# Patient Record
Sex: Male | Born: 1935 | Race: Black or African American | Hispanic: No | Smoking: Former smoker
Health system: Southern US, Community
[De-identification: ages and names within clinical notes are randomized; demographics above are authoritative.]

## PROBLEM LIST (undated history)

## (undated) DIAGNOSIS — R0602 Shortness of breath: Secondary | ICD-10-CM

## (undated) DIAGNOSIS — I2510 Atherosclerotic heart disease of native coronary artery without angina pectoris: Secondary | ICD-10-CM

## (undated) DIAGNOSIS — I10 Essential (primary) hypertension: Secondary | ICD-10-CM

## (undated) DIAGNOSIS — I639 Cerebral infarction, unspecified: Secondary | ICD-10-CM

## (undated) DIAGNOSIS — K8020 Calculus of gallbladder without cholecystitis without obstruction: Secondary | ICD-10-CM

## (undated) DIAGNOSIS — E46 Unspecified protein-calorie malnutrition: Secondary | ICD-10-CM

## (undated) DIAGNOSIS — I219 Acute myocardial infarction, unspecified: Secondary | ICD-10-CM

## (undated) DIAGNOSIS — IMO0002 Reserved for concepts with insufficient information to code with codable children: Secondary | ICD-10-CM

## (undated) DIAGNOSIS — H53129 Transient visual loss, unspecified eye: Secondary | ICD-10-CM

## (undated) DIAGNOSIS — H548 Legal blindness, as defined in USA: Secondary | ICD-10-CM

## (undated) DIAGNOSIS — I4891 Unspecified atrial fibrillation: Secondary | ICD-10-CM

## (undated) DIAGNOSIS — J449 Chronic obstructive pulmonary disease, unspecified: Secondary | ICD-10-CM

## (undated) DIAGNOSIS — I8290 Acute embolism and thrombosis of unspecified vein: Secondary | ICD-10-CM

## (undated) DIAGNOSIS — E785 Hyperlipidemia, unspecified: Secondary | ICD-10-CM

## (undated) HISTORY — DX: Acute myocardial infarction, unspecified: I21.9

## (undated) HISTORY — DX: Essential (primary) hypertension: I10

## (undated) HISTORY — PX: TRANSMETATARSAL AMPUTATION: SHX6197

## (undated) HISTORY — DX: Cerebral infarction, unspecified: I63.9

## (undated) HISTORY — PX: PR VEIN BYPASS GRAFT,AORTO-FEM-POP: 35551

## (undated) HISTORY — DX: Acute embolism and thrombosis of unspecified vein: I82.90

## (undated) HISTORY — DX: Transient visual loss, unspecified eye: H53.129

## (undated) HISTORY — DX: Atherosclerotic heart disease of native coronary artery without angina pectoris: I25.10

## (undated) HISTORY — PX: ABOVE KNEE LEG AMPUTATION: SUR20

## (undated) HISTORY — DX: Hyperlipidemia, unspecified: E78.5

## (undated) HISTORY — PX: OTHER SURGICAL HISTORY: SHX169

## (undated) HISTORY — PX: THORACOTOMY: SUR1349

## (undated) HISTORY — PX: ANTERIOR CERVICAL DECOMP/DISCECTOMY FUSION: SHX1161

---

## 2003-11-15 DIAGNOSIS — I219 Acute myocardial infarction, unspecified: Secondary | ICD-10-CM

## 2003-11-15 HISTORY — DX: Acute myocardial infarction, unspecified: I21.9

## 2008-10-14 DIAGNOSIS — I639 Cerebral infarction, unspecified: Secondary | ICD-10-CM

## 2008-10-14 HISTORY — DX: Cerebral infarction, unspecified: I63.9

## 2008-10-27 ENCOUNTER — Inpatient Hospital Stay (HOSPITAL_COMMUNITY): Admission: EM | Admit: 2008-10-27 | Discharge: 2008-10-31 | Payer: Self-pay | Admitting: Emergency Medicine

## 2008-10-29 ENCOUNTER — Ambulatory Visit: Payer: Self-pay | Admitting: Physical Medicine & Rehabilitation

## 2008-10-29 ENCOUNTER — Encounter (INDEPENDENT_AMBULATORY_CARE_PROVIDER_SITE_OTHER): Payer: Self-pay | Admitting: Internal Medicine

## 2008-10-31 ENCOUNTER — Inpatient Hospital Stay (HOSPITAL_COMMUNITY)
Admission: RE | Admit: 2008-10-31 | Discharge: 2008-11-18 | Payer: Self-pay | Admitting: Physical Medicine & Rehabilitation

## 2008-11-14 DIAGNOSIS — I4891 Unspecified atrial fibrillation: Secondary | ICD-10-CM

## 2008-11-14 HISTORY — PX: OTHER SURGICAL HISTORY: SHX169

## 2008-11-14 HISTORY — DX: Unspecified atrial fibrillation: I48.91

## 2008-11-14 HISTORY — PX: HIP FRACTURE SURGERY: SHX118

## 2008-12-09 ENCOUNTER — Encounter (HOSPITAL_COMMUNITY): Admission: RE | Admit: 2008-12-09 | Discharge: 2009-03-09 | Payer: Self-pay | Admitting: Endocrinology

## 2008-12-16 ENCOUNTER — Ambulatory Visit (HOSPITAL_COMMUNITY): Admission: RE | Admit: 2008-12-16 | Discharge: 2008-12-16 | Payer: Self-pay | Admitting: Neurology

## 2009-01-23 ENCOUNTER — Ambulatory Visit (HOSPITAL_COMMUNITY): Admission: RE | Admit: 2009-01-23 | Discharge: 2009-01-23 | Payer: Self-pay | Admitting: Interventional Radiology

## 2009-01-30 ENCOUNTER — Telehealth: Payer: Self-pay | Admitting: Gastroenterology

## 2009-01-30 DIAGNOSIS — D62 Acute posthemorrhagic anemia: Secondary | ICD-10-CM

## 2009-01-30 DIAGNOSIS — E119 Type 2 diabetes mellitus without complications: Secondary | ICD-10-CM | POA: Insufficient documentation

## 2009-02-02 ENCOUNTER — Ambulatory Visit: Payer: Self-pay | Admitting: Gastroenterology

## 2009-02-02 DIAGNOSIS — I6350 Cerebral infarction due to unspecified occlusion or stenosis of unspecified cerebral artery: Secondary | ICD-10-CM | POA: Insufficient documentation

## 2009-02-12 ENCOUNTER — Ambulatory Visit: Payer: Self-pay | Admitting: Gastroenterology

## 2009-02-18 ENCOUNTER — Encounter: Payer: Self-pay | Admitting: Gastroenterology

## 2009-02-18 ENCOUNTER — Ambulatory Visit: Payer: Self-pay | Admitting: Gastroenterology

## 2009-02-20 ENCOUNTER — Encounter: Payer: Self-pay | Admitting: Gastroenterology

## 2009-02-23 ENCOUNTER — Telehealth: Payer: Self-pay | Admitting: Gastroenterology

## 2009-02-23 ENCOUNTER — Encounter: Payer: Self-pay | Admitting: Gastroenterology

## 2009-02-24 ENCOUNTER — Telehealth: Payer: Self-pay | Admitting: Gastroenterology

## 2009-03-08 ENCOUNTER — Encounter: Payer: Self-pay | Admitting: Gastroenterology

## 2009-03-19 ENCOUNTER — Encounter: Payer: Self-pay | Admitting: Gastroenterology

## 2009-03-20 ENCOUNTER — Telehealth: Payer: Self-pay | Admitting: Gastroenterology

## 2009-03-20 ENCOUNTER — Inpatient Hospital Stay (HOSPITAL_COMMUNITY): Admission: EM | Admit: 2009-03-20 | Discharge: 2009-03-21 | Payer: Self-pay | Admitting: Emergency Medicine

## 2009-03-23 ENCOUNTER — Telehealth: Payer: Self-pay | Admitting: Gastroenterology

## 2009-03-27 ENCOUNTER — Encounter: Payer: Self-pay | Admitting: Gastroenterology

## 2009-03-30 ENCOUNTER — Telehealth: Payer: Self-pay | Admitting: Gastroenterology

## 2009-04-01 ENCOUNTER — Encounter: Payer: Self-pay | Admitting: Gastroenterology

## 2009-04-02 ENCOUNTER — Telehealth: Payer: Self-pay | Admitting: Gastroenterology

## 2009-04-03 ENCOUNTER — Telehealth: Payer: Self-pay | Admitting: Gastroenterology

## 2009-04-03 ENCOUNTER — Encounter: Payer: Self-pay | Admitting: Gastroenterology

## 2009-04-07 ENCOUNTER — Inpatient Hospital Stay (HOSPITAL_COMMUNITY): Admission: RE | Admit: 2009-04-07 | Discharge: 2009-04-08 | Payer: Self-pay | Admitting: Interventional Radiology

## 2009-04-10 ENCOUNTER — Encounter: Payer: Self-pay | Admitting: Gastroenterology

## 2009-04-21 ENCOUNTER — Encounter: Payer: Self-pay | Admitting: Interventional Radiology

## 2009-05-03 ENCOUNTER — Inpatient Hospital Stay (HOSPITAL_COMMUNITY): Admission: EM | Admit: 2009-05-03 | Discharge: 2009-05-07 | Payer: Self-pay | Admitting: Emergency Medicine

## 2009-07-22 ENCOUNTER — Encounter (INDEPENDENT_AMBULATORY_CARE_PROVIDER_SITE_OTHER): Payer: Self-pay | Admitting: *Deleted

## 2009-07-22 ENCOUNTER — Ambulatory Visit (HOSPITAL_COMMUNITY): Admission: RE | Admit: 2009-07-22 | Discharge: 2009-07-22 | Payer: Self-pay | Admitting: Interventional Radiology

## 2009-08-12 ENCOUNTER — Encounter: Payer: Self-pay | Admitting: Gastroenterology

## 2009-08-21 ENCOUNTER — Encounter: Payer: Self-pay | Admitting: Gastroenterology

## 2009-08-25 ENCOUNTER — Encounter: Payer: Self-pay | Admitting: Gastroenterology

## 2009-09-01 ENCOUNTER — Encounter: Payer: Self-pay | Admitting: Gastroenterology

## 2009-09-15 ENCOUNTER — Encounter (INDEPENDENT_AMBULATORY_CARE_PROVIDER_SITE_OTHER): Payer: Self-pay | Admitting: *Deleted

## 2009-09-16 ENCOUNTER — Ambulatory Visit: Payer: Self-pay | Admitting: Gastroenterology

## 2009-09-18 ENCOUNTER — Telehealth: Payer: Self-pay | Admitting: Gastroenterology

## 2009-10-13 ENCOUNTER — Ambulatory Visit (HOSPITAL_COMMUNITY): Admission: RE | Admit: 2009-10-13 | Discharge: 2009-10-13 | Payer: Self-pay | Admitting: Gastroenterology

## 2009-10-13 ENCOUNTER — Ambulatory Visit: Payer: Self-pay | Admitting: Gastroenterology

## 2009-10-14 ENCOUNTER — Telehealth: Payer: Self-pay | Admitting: Gastroenterology

## 2009-10-14 ENCOUNTER — Encounter: Payer: Self-pay | Admitting: Gastroenterology

## 2009-11-05 ENCOUNTER — Encounter: Payer: Self-pay | Admitting: Gastroenterology

## 2009-11-14 DIAGNOSIS — E46 Unspecified protein-calorie malnutrition: Secondary | ICD-10-CM

## 2009-11-14 HISTORY — DX: Unspecified protein-calorie malnutrition: E46

## 2010-02-02 ENCOUNTER — Encounter: Payer: Self-pay | Admitting: Gastroenterology

## 2010-02-17 ENCOUNTER — Ambulatory Visit: Payer: Self-pay | Admitting: Gastroenterology

## 2010-03-02 ENCOUNTER — Encounter: Payer: Self-pay | Admitting: Gastroenterology

## 2010-03-16 ENCOUNTER — Encounter: Payer: Self-pay | Admitting: Gastroenterology

## 2010-04-13 ENCOUNTER — Ambulatory Visit: Payer: Self-pay | Admitting: Vascular Surgery

## 2010-04-13 ENCOUNTER — Inpatient Hospital Stay (HOSPITAL_COMMUNITY): Admission: EM | Admit: 2010-04-13 | Discharge: 2010-04-19 | Payer: Self-pay | Admitting: Emergency Medicine

## 2010-04-15 ENCOUNTER — Ambulatory Visit: Payer: Self-pay | Admitting: Infectious Disease

## 2010-04-15 ENCOUNTER — Encounter: Payer: Self-pay | Admitting: Vascular Surgery

## 2010-04-23 ENCOUNTER — Ambulatory Visit: Payer: Medicare Other | Admitting: Internal Medicine

## 2010-04-28 ENCOUNTER — Ambulatory Visit: Payer: Medicare Other | Admitting: Internal Medicine

## 2010-05-02 ENCOUNTER — Ambulatory Visit: Payer: Medicare Other | Admitting: Internal Medicine

## 2010-05-07 ENCOUNTER — Encounter (HOSPITAL_BASED_OUTPATIENT_CLINIC_OR_DEPARTMENT_OTHER): Admission: RE | Admit: 2010-05-07 | Discharge: 2010-06-29 | Payer: Self-pay | Admitting: Internal Medicine

## 2010-05-13 ENCOUNTER — Inpatient Hospital Stay (HOSPITAL_COMMUNITY): Admission: EM | Admit: 2010-05-13 | Discharge: 2010-05-15 | Payer: Self-pay | Admitting: Emergency Medicine

## 2010-05-14 ENCOUNTER — Ambulatory Visit: Payer: Self-pay | Admitting: Oncology

## 2010-05-19 ENCOUNTER — Ambulatory Visit: Payer: Medicare Other

## 2010-05-20 ENCOUNTER — Ambulatory Visit: Payer: Medicare Other | Admitting: Internal Medicine

## 2010-05-24 ENCOUNTER — Ambulatory Visit: Payer: Medicare Other | Admitting: Internal Medicine

## 2010-05-27 ENCOUNTER — Ambulatory Visit: Payer: Self-pay | Admitting: Vascular Surgery

## 2010-05-29 ENCOUNTER — Ambulatory Visit: Payer: Medicare Other | Admitting: Internal Medicine

## 2010-06-01 ENCOUNTER — Ambulatory Visit: Payer: Self-pay | Admitting: Vascular Surgery

## 2010-06-01 ENCOUNTER — Ambulatory Visit: Payer: Medicare Other

## 2010-06-01 ENCOUNTER — Encounter: Payer: Self-pay | Admitting: Vascular Surgery

## 2010-06-01 ENCOUNTER — Inpatient Hospital Stay (HOSPITAL_COMMUNITY): Admission: RE | Admit: 2010-06-01 | Discharge: 2010-06-07 | Payer: Self-pay | Admitting: Vascular Surgery

## 2010-07-14 ENCOUNTER — Ambulatory Visit: Payer: Self-pay | Admitting: Vascular Surgery

## 2010-08-26 ENCOUNTER — Emergency Department (HOSPITAL_COMMUNITY)
Admission: EM | Admit: 2010-08-26 | Discharge: 2010-08-26 | Payer: Self-pay | Source: Home / Self Care | Admitting: Emergency Medicine

## 2010-11-03 ENCOUNTER — Ambulatory Visit: Payer: Self-pay | Admitting: Vascular Surgery

## 2010-11-14 DIAGNOSIS — K8020 Calculus of gallbladder without cholecystitis without obstruction: Secondary | ICD-10-CM

## 2010-11-14 DIAGNOSIS — IMO0002 Reserved for concepts with insufficient information to code with codable children: Secondary | ICD-10-CM

## 2010-11-14 HISTORY — DX: Calculus of gallbladder without cholecystitis without obstruction: K80.20

## 2010-11-14 HISTORY — DX: Reserved for concepts with insufficient information to code with codable children: IMO0002

## 2010-12-01 ENCOUNTER — Ambulatory Visit
Admission: RE | Admit: 2010-12-01 | Discharge: 2010-12-01 | Payer: Self-pay | Source: Home / Self Care | Attending: Vascular Surgery | Admitting: Vascular Surgery

## 2010-12-01 ENCOUNTER — Encounter
Admission: RE | Admit: 2010-12-01 | Discharge: 2010-12-01 | Payer: Self-pay | Source: Home / Self Care | Attending: Vascular Surgery | Admitting: Vascular Surgery

## 2010-12-05 ENCOUNTER — Encounter: Payer: Self-pay | Admitting: Physical Medicine & Rehabilitation

## 2010-12-05 ENCOUNTER — Encounter: Payer: Self-pay | Admitting: Interventional Radiology

## 2010-12-05 ENCOUNTER — Encounter: Payer: Self-pay | Admitting: Cardiology

## 2010-12-14 NOTE — Assessment & Plan Note (Signed)
Summary: F/U FROM ENDO. AND LABS               DEBORAH   History of Present Illness Visit Type: Follow-up Visit Primary GI MD: Melvia Heaps MD [facility] Primary Provider: Adrian Prince, MD Requesting Provider: Baltazar Najjar, MD Chief Complaint: endo/labs History of Present Illness:   [name] has return for scheduled GI followup.  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  He has chronic GI  blood loss, presumably due to other small bowel AVMs.  He takes iron 3 times a day.  Hemoglobin on February 03, 2010 was 9.4.   GI Review of Systems      Denies abdominal pain, acid reflux, belching, bloating, chest pain, dysphagia with liquids, dysphagia with solids, heartburn, loss of appetite, nausea, vomiting, vomiting blood, weight loss, and  weight gain.        Denies anal fissure, black tarry stools, change in bowel habit, constipation, diarrhea, diverticulosis, fecal incontinence, heme positive stool, hemorrhoids, irritable bowel syndrome, jaundice, light color stool, liver problems, rectal bleeding, and  rectal pain.    Current Medications (verified): 1)  Actos 45 Mg Tabs (Pioglitazone Hcl) .Marland Kitchen.. 1 Once Daily 2)  Ferrous Sulfate 325 (65 Fe) Mg  Tabs (Ferrous Sulfate) .... One Tablet By Mouth Three Times A Day 3)  Lipitor 80 Mg Tabs (Atorvastatin Calcium) .... One Tablet By Mouth At Bedtime 4)  Metformin Hcl 850 Mg Tabs (Metformin Hcl) .... One Tablet By Mouth Two Times A Day 5)  Plavix 75 Mg Tabs (Clopidogrel Bisulfate) .... On Hold 6)  Zantac 150 Mg Tabs (Ranitidine Hcl) .... One Tablet By Mouth Two Times A Day 7)  Zolpidem Tartrate 5 Mg Tabs (Zolpidem Tartrate) .... One Tablet By Mouth At Bedtime As Needed 8)  Metoprolol Tartrate 25 Mg Tabs (Metoprolol Tartrate) .... Once Daily 9)  Oyst-Cal 500 Mg Tabs (Oyster Shell) .... Two Times A Day 10)  Nitrostat 0.4 Mg Subl (Nitroglycerin) .... As Needed 11)  Tylenol 325 Mg Tabs (Acetaminophen) .... As Needed 12)  Vitamin D  50000iu .... Once Monthly  Allergies (verified): No Known Drug Allergies  Past History:  Past Medical History: Reviewed history from 02/02/2009 and no changes required. Current Problems:  DM (ICD-250.00) ANEMIA (ICD-285.9) Diabetes Hyperlipidemia Stroke Coronary Artery Disease  Past Surgical History: Reviewed history from 02/02/2009 and no changes required. Rt AKA Left transmetatarsal amputation Thoracotomy  Family History: Reviewed history from 02/02/2009 and no changes required. Family History of Diabetes:  Family History of Heart Disease:   Social History: Reviewed history from 02/02/2009 and no changes required. widowed, 1 girl Patient is a former smoker.  Alcohol Use - no Illicit Drug Use - no  Review of Systems  The patient denies allergy/sinus, anemia, anxiety-new, arthritis/joint pain, back pain, blood in urine, breast changes/lumps, change in vision, confusion, cough, coughing up blood, depression-new, fainting, fatigue, fever, headaches-new, hearing problems, heart murmur, heart rhythm changes, itching, menstrual pain, muscle pains/cramps, night sweats, nosebleeds, pregnancy symptoms, shortness of breath, skin rash, sleeping problems, sore throat, swelling of feet/legs, swollen lymph glands, thirst - excessive , urination - excessive , urination changes/pain, urine leakage, vision changes, and voice change.    Vital Signs:  Patient profile:   76 year old male Pulse rate:   60 / minute Pulse rhythm:   regular BP sitting:   110 / 60  (left arm) Cuff size:   regular  Vitals Entered By: June McMurray CMA Duncan Dull) (February 17, 2010  10:27 AM)   Impression & Recommendations:  Problem # 1:  ANEMIA (ICD-285.9) He has chronic  GI  blood loss very likely due to small bowel AVMs.  Blood counts have remained stable on supplementary iron  Recommendations #1 continue iron supplementation indefinitely #2 check CBC monthly #3 no further GI workup or therapy at this  time #4 continue to hold Plavix since this could exacerbate any bleeding.  Alternatively, Plavix can be resumed at the risk of worsening his rate of blood loss and possibly requiring blood transfusions.  I will leave this decision to Dr. Leanord Hawking  Patient Instructions: 1)  Please continue current medications.  2)  Please schedule a follow-up appointment as needed.  3)  The medication list was reviewed and reconciled.  All changed / newly prescribed medications were explained.  A complete medication list was provided to the patient / caregiver.

## 2010-12-15 ENCOUNTER — Encounter: Payer: Self-pay | Admitting: Vascular Surgery

## 2011-01-29 LAB — SURGICAL PCR SCREEN: Staphylococcus aureus: NEGATIVE

## 2011-01-29 LAB — BASIC METABOLIC PANEL
BUN: 20 mg/dL (ref 6–23)
Chloride: 97 mEq/L (ref 96–112)
Potassium: 3.7 mEq/L (ref 3.5–5.1)

## 2011-01-29 LAB — GLUCOSE, CAPILLARY
Glucose-Capillary: 107 mg/dL — ABNORMAL HIGH (ref 70–99)
Glucose-Capillary: 109 mg/dL — ABNORMAL HIGH (ref 70–99)
Glucose-Capillary: 132 mg/dL — ABNORMAL HIGH (ref 70–99)
Glucose-Capillary: 146 mg/dL — ABNORMAL HIGH (ref 70–99)
Glucose-Capillary: 152 mg/dL — ABNORMAL HIGH (ref 70–99)
Glucose-Capillary: 179 mg/dL — ABNORMAL HIGH (ref 70–99)
Glucose-Capillary: 182 mg/dL — ABNORMAL HIGH (ref 70–99)
Glucose-Capillary: 187 mg/dL — ABNORMAL HIGH (ref 70–99)
Glucose-Capillary: 192 mg/dL — ABNORMAL HIGH (ref 70–99)
Glucose-Capillary: 193 mg/dL — ABNORMAL HIGH (ref 70–99)
Glucose-Capillary: 233 mg/dL — ABNORMAL HIGH (ref 70–99)
Glucose-Capillary: 62 mg/dL — ABNORMAL LOW (ref 70–99)
Glucose-Capillary: 66 mg/dL — ABNORMAL LOW (ref 70–99)
Glucose-Capillary: 88 mg/dL (ref 70–99)

## 2011-01-29 LAB — CBC
Hemoglobin: 7.8 g/dL — ABNORMAL LOW (ref 13.0–17.0)
MCH: 29.8 pg (ref 26.0–34.0)
RBC: 2.62 MIL/uL — ABNORMAL LOW (ref 4.22–5.81)

## 2011-01-29 LAB — POCT I-STAT 4, (NA,K, GLUC, HGB,HCT)
Hemoglobin: 12.9 g/dL — ABNORMAL LOW (ref 13.0–17.0)
Potassium: 4 mEq/L (ref 3.5–5.1)

## 2011-01-30 LAB — COMPREHENSIVE METABOLIC PANEL
ALT: 11 U/L (ref 0–53)
Albumin: 2.2 g/dL — ABNORMAL LOW (ref 3.5–5.2)
Alkaline Phosphatase: 36 U/L — ABNORMAL LOW (ref 39–117)
BUN: 20 mg/dL (ref 6–23)
GFR calc Af Amer: >60 mL/min (ref >60)
Potassium: 4 mEq/L (ref 3.5–5.1)
Sodium: 137 mEq/L (ref 135–145)
Total Protein: 6 g/dL (ref 6.0–8.3)

## 2011-01-30 LAB — CROSSMATCH
ABO/RH(D): O POS
Antibody Screen: NEGATIVE

## 2011-01-30 LAB — RETICULOCYTES: Retic Ct Pct: 1.3 % (ref 0.4–3.1)

## 2011-01-30 LAB — LACTATE DEHYDROGENASE: LDH: 135 U/L (ref 94–250)

## 2011-01-30 LAB — CBC
Platelets: 198 10*3/uL (ref 150–400)
Platelets: 230 10*3/uL (ref 150–400)
RBC: 3.47 MIL/uL — ABNORMAL LOW (ref 4.22–5.81)
RDW: 16.5 % — ABNORMAL HIGH (ref 11.5–15.5)
WBC: 8.3 10*3/uL (ref 4.0–10.5)
WBC: 6.9 10*3/uL (ref 4.0–10.5)

## 2011-01-30 LAB — APTT: aPTT: 36 seconds (ref 24–37)

## 2011-01-30 LAB — FERRITIN: Ferritin: 73 ng/mL (ref 22–322)

## 2011-01-30 LAB — DIFFERENTIAL
Basophils Relative: 0 % (ref 0–1)
Monocytes Absolute: 0.8 10*3/uL (ref 0.1–1.0)
Monocytes Relative: 12 % (ref 3–12)
Neutro Abs: 5.4 10*3/uL (ref 1.7–7.7)

## 2011-01-30 LAB — BASIC METABOLIC PANEL
CO2: 29 mEq/L (ref 19–32)
GFR calc Af Amer: >60 mL/min (ref >60)
GFR calc non Af Amer: >60 mL/min (ref >60)
Glucose, Bld: 296 mg/dL — ABNORMAL HIGH (ref 70–99)
Potassium: 3.9 mEq/L (ref 3.5–5.1)
Sodium: 137 mEq/L (ref 135–145)

## 2011-01-30 LAB — SAMPLE TO BLOOD BANK

## 2011-01-30 LAB — FOLATE: Folate: >20 ng/mL

## 2011-01-30 LAB — SAVE SMEAR

## 2011-01-30 LAB — HAPTOGLOBIN: Haptoglobin: 313 mg/dL — ABNORMAL HIGH (ref 16–200)

## 2011-01-30 LAB — HEMOCCULT GUIAC POC 1CARD (OFFICE)
Fecal Occult Bld: NEGATIVE
Fecal Occult Bld: NEGATIVE

## 2011-01-30 LAB — METHYLMALONIC ACID, SERUM: Methylmalonic Acid, Quantitative: 250 nmol/L (ref 87–318)

## 2011-01-31 LAB — CULTURE, BLOOD (ROUTINE X 2): Culture: NO GROWTH

## 2011-01-31 LAB — DIFFERENTIAL
Basophils Absolute: 0 10*3/uL (ref 0.0–0.1)
Eosinophils Relative: 0 % (ref 0–5)
Lymphocytes Relative: 11 % — ABNORMAL LOW (ref 12–46)
Monocytes Absolute: 0.8 10*3/uL (ref 0.1–1.0)
Monocytes Relative: 9 % (ref 3–12)

## 2011-01-31 LAB — BASIC METABOLIC PANEL
BUN: 21 mg/dL (ref 6–23)
CO2: 27 mEq/L (ref 19–32)
CO2: 26 mEq/L (ref 19–32)
Calcium: 9 mg/dL (ref 8.4–10.5)
Calcium: 9 mg/dL (ref 8.4–10.5)
Calcium: 9.8 mg/dL (ref 8.4–10.5)
Creatinine, Ser: 1.01 mg/dL (ref 0.4–1.5)
GFR calc Af Amer: >60 mL/min (ref >60)
GFR calc Af Amer: >60 mL/min (ref >60)
GFR calc non Af Amer: >60 mL/min (ref >60)
GFR calc non Af Amer: >60 mL/min (ref >60)
GFR calc non Af Amer: >60 mL/min (ref >60)
Glucose, Bld: 110 mg/dL — ABNORMAL HIGH (ref 70–99)
Glucose, Bld: 185 mg/dL — ABNORMAL HIGH (ref 70–99)
Glucose, Bld: 76 mg/dL (ref 70–99)
Potassium: 4.1 mEq/L (ref 3.5–5.1)
Potassium: 4.2 mEq/L (ref 3.5–5.1)
Sodium: 135 mEq/L (ref 135–145)
Sodium: 135 mEq/L (ref 135–145)
Sodium: 137 mEq/L (ref 135–145)

## 2011-01-31 LAB — GLUCOSE, CAPILLARY
Glucose-Capillary: 114 mg/dL — ABNORMAL HIGH (ref 70–99)
Glucose-Capillary: 120 mg/dL — ABNORMAL HIGH (ref 70–99)
Glucose-Capillary: 121 mg/dL — ABNORMAL HIGH (ref 70–99)
Glucose-Capillary: 127 mg/dL — ABNORMAL HIGH (ref 70–99)
Glucose-Capillary: 128 mg/dL — ABNORMAL HIGH (ref 70–99)
Glucose-Capillary: 157 mg/dL — ABNORMAL HIGH (ref 70–99)
Glucose-Capillary: 162 mg/dL — ABNORMAL HIGH (ref 70–99)
Glucose-Capillary: 171 mg/dL — ABNORMAL HIGH (ref 70–99)
Glucose-Capillary: 200 mg/dL — ABNORMAL HIGH (ref 70–99)

## 2011-01-31 LAB — COMPREHENSIVE METABOLIC PANEL
ALT: 11 U/L (ref 0–53)
AST: 16 U/L (ref 0–37)
AST: 16 U/L (ref 0–37)
Albumin: 2.9 g/dL — ABNORMAL LOW (ref 3.5–5.2)
Alkaline Phosphatase: 46 U/L (ref 39–117)
Alkaline Phosphatase: 47 U/L (ref 39–117)
BUN: 29 mg/dL — ABNORMAL HIGH (ref 6–23)
CO2: 28 mEq/L (ref 19–32)
Chloride: 100 mEq/L (ref 96–112)
Chloride: 100 mEq/L (ref 96–112)
Creatinine, Ser: 0.96 mg/dL (ref 0.4–1.5)
GFR calc Af Amer: >60 mL/min (ref >60)
GFR calc non Af Amer: >60 mL/min (ref >60)
Potassium: 4 mEq/L (ref 3.5–5.1)
Potassium: 4.2 mEq/L (ref 3.5–5.1)
Sodium: 132 mEq/L — ABNORMAL LOW (ref 135–145)
Total Bilirubin: 0.4 mg/dL (ref 0.3–1.2)
Total Bilirubin: 0.5 mg/dL (ref 0.3–1.2)

## 2011-01-31 LAB — CK TOTAL AND CKMB (NOT AT ARMC)
CK, MB: 1.2 ng/mL (ref 0.3–4.0)
Relative Index: INVALID (ref 0.0–2.5)
Total CK: 49 U/L (ref 7–232)

## 2011-01-31 LAB — IRON AND TIBC: TIBC: 230 ug/dL (ref 215–435)

## 2011-01-31 LAB — CBC
HCT: 27.7 % — ABNORMAL LOW (ref 39.0–52.0)
HCT: 29.2 % — ABNORMAL LOW (ref 39.0–52.0)
HCT: 31.4 % — ABNORMAL LOW (ref 39.0–52.0)
Hemoglobin: 9.2 g/dL — ABNORMAL LOW (ref 13.0–17.0)
Hemoglobin: 10.5 g/dL — ABNORMAL LOW (ref 13.0–17.0)
MCHC: 33.5 g/dL (ref 30.0–36.0)
MCV: 95 fL (ref 78.0–100.0)
Platelets: 202 10*3/uL (ref 150–400)
Platelets: 206 10*3/uL (ref 150–400)
RBC: 3.08 MIL/uL — ABNORMAL LOW (ref 4.22–5.81)
RBC: 3.31 MIL/uL — ABNORMAL LOW (ref 4.22–5.81)
RDW: 15.1 % (ref 11.5–15.5)
RDW: 14.9 % (ref 11.5–15.5)
WBC: 6.4 10*3/uL (ref 4.0–10.5)
WBC: 7.5 10*3/uL (ref 4.0–10.5)

## 2011-01-31 LAB — FERRITIN: Ferritin: 59 ng/mL (ref 22–322)

## 2011-01-31 LAB — LIPID PANEL
Cholesterol: 122 mg/dL (ref 0–200)
HDL: 54 mg/dL (ref >39)
Total CHOL/HDL Ratio: 2.3 RATIO
VLDL: 8 mg/dL (ref 0–40)

## 2011-01-31 LAB — RETICULOCYTES
RBC.: 3.19 MIL/uL — ABNORMAL LOW (ref 4.22–5.81)
Retic Ct Pct: 1.1 % (ref 0.4–3.1)

## 2011-01-31 LAB — VANCOMYCIN, TROUGH: Vancomycin Tr: 32.2 ug/mL (ref 10.0–20.0)

## 2011-01-31 LAB — PROTIME-INR: Prothrombin Time: 14.7 seconds (ref 11.6–15.2)

## 2011-01-31 LAB — TROPONIN I: Troponin I: 0.01 ng/mL (ref 0.00–0.06)

## 2011-01-31 LAB — HOMOCYSTEINE: Homocysteine: 14.5 umol/L (ref 4.0–15.4)

## 2011-01-31 LAB — FOLATE: Folate: >20 ng/mL

## 2011-01-31 LAB — HEMOGLOBIN A1C: Hgb A1c MFr Bld: 6.5 % — ABNORMAL HIGH (ref <5.7)

## 2011-02-16 LAB — DIFFERENTIAL
Eosinophils Relative: 1 % (ref 0–5)
Lymphs Abs: 0.8 10*3/uL (ref 0.7–4.0)
Monocytes Absolute: 0.3 10*3/uL (ref 0.1–1.0)
Monocytes Relative: 8 % (ref 3–12)
Neutro Abs: 2.1 10*3/uL (ref 1.7–7.7)

## 2011-02-16 LAB — GLUCOSE, CAPILLARY: Glucose-Capillary: 97 mg/dL (ref 70–99)

## 2011-02-16 LAB — CBC
HCT: 26.6 % — ABNORMAL LOW (ref 39.0–52.0)
Hemoglobin: 8.6 g/dL — ABNORMAL LOW (ref 13.0–17.0)
MCV: 88.1 fL (ref 78.0–100.0)
Platelets: 180 10*3/uL (ref 150–400)
RDW: 21.4 % — ABNORMAL HIGH (ref 11.5–15.5)

## 2011-02-18 LAB — PROTIME-INR: INR: 1 (ref 0.00–1.49)

## 2011-02-18 LAB — BASIC METABOLIC PANEL
Calcium: 9.8 mg/dL (ref 8.4–10.5)
GFR calc Af Amer: >60 mL/min (ref >60)
GFR calc non Af Amer: >60 mL/min (ref >60)
Sodium: 138 mEq/L (ref 135–145)

## 2011-02-18 LAB — GLUCOSE, CAPILLARY: Glucose-Capillary: 99 mg/dL (ref 70–99)

## 2011-02-18 LAB — APTT: aPTT: 31 seconds (ref 24–37)

## 2011-02-18 LAB — CBC
Hemoglobin: 8.9 g/dL — ABNORMAL LOW (ref 13.0–17.0)
RBC: 2.96 MIL/uL — ABNORMAL LOW (ref 4.22–5.81)
RDW: 16.4 % — ABNORMAL HIGH (ref 11.5–15.5)

## 2011-02-21 LAB — BASIC METABOLIC PANEL
BUN: 12 mg/dL (ref 6–23)
BUN: 18 mg/dL (ref 6–23)
CO2: 26 mEq/L (ref 19–32)
CO2: 28 mEq/L (ref 19–32)
Calcium: 8.3 mg/dL — ABNORMAL LOW (ref 8.4–10.5)
Calcium: 8.5 mg/dL (ref 8.4–10.5)
Calcium: 8.7 mg/dL (ref 8.4–10.5)
Chloride: 105 mEq/L (ref 96–112)
Chloride: 106 mEq/L (ref 96–112)
Creatinine, Ser: 1.3 mg/dL (ref 0.4–1.5)
GFR calc Af Amer: >60 mL/min (ref >60)
GFR calc Af Amer: >60 mL/min (ref >60)
GFR calc non Af Amer: >60 mL/min (ref >60)
Glucose, Bld: 104 mg/dL — ABNORMAL HIGH (ref 70–99)
Glucose, Bld: 137 mg/dL — ABNORMAL HIGH (ref 70–99)
Glucose, Bld: 138 mg/dL — ABNORMAL HIGH (ref 70–99)
Potassium: 3.5 mEq/L (ref 3.5–5.1)
Sodium: 137 mEq/L (ref 135–145)
Sodium: 137 mEq/L (ref 135–145)
Sodium: 139 mEq/L (ref 135–145)

## 2011-02-21 LAB — BLOOD GAS, ARTERIAL
Bicarbonate: 22.1 mEq/L (ref 20.0–24.0)
Drawn by: 246861
pCO2 arterial: 38.8 mmHg (ref 35.0–45.0)
pH, Arterial: 7.373 (ref 7.350–7.450)
pO2, Arterial: 221 mmHg — ABNORMAL HIGH (ref 80.0–100.0)

## 2011-02-21 LAB — URINALYSIS, ROUTINE W REFLEX MICROSCOPIC
Bilirubin Urine: NEGATIVE
Glucose, UA: NEGATIVE mg/dL
Hgb urine dipstick: NEGATIVE
Specific Gravity, Urine: 1.007 (ref 1.005–1.030)
Urobilinogen, UA: 0.2 mg/dL (ref 0.0–1.0)
pH: 5 (ref 5.0–8.0)

## 2011-02-21 LAB — CBC
HCT: 28 % — ABNORMAL LOW (ref 39.0–52.0)
Hemoglobin: 7.8 g/dL — CL (ref 13.0–17.0)
Hemoglobin: 9.6 g/dL — ABNORMAL LOW (ref 13.0–17.0)
MCHC: 33.2 g/dL (ref 30.0–36.0)
MCHC: 33.6 g/dL (ref 30.0–36.0)
MCHC: 34.3 g/dL (ref 30.0–36.0)
MCV: 87.5 fL (ref 78.0–100.0)
MCV: 88.8 fL (ref 78.0–100.0)
Platelets: 139 10*3/uL — ABNORMAL LOW (ref 150–400)
Platelets: 191 10*3/uL (ref 150–400)
RBC: 2.64 MIL/uL — ABNORMAL LOW (ref 4.22–5.81)
RBC: 3.18 MIL/uL — ABNORMAL LOW (ref 4.22–5.81)
RDW: 17.5 % — ABNORMAL HIGH (ref 11.5–15.5)
RDW: 19.7 % — ABNORMAL HIGH (ref 11.5–15.5)
WBC: 7.6 10*3/uL (ref 4.0–10.5)
WBC: 9.6 10*3/uL (ref 4.0–10.5)
WBC: 9.9 10*3/uL (ref 4.0–10.5)

## 2011-02-21 LAB — PREPARE RBC (CROSSMATCH)

## 2011-02-21 LAB — TYPE AND SCREEN: Antibody Screen: NEGATIVE

## 2011-02-21 LAB — GLUCOSE, CAPILLARY
Glucose-Capillary: 111 mg/dL — ABNORMAL HIGH (ref 70–99)
Glucose-Capillary: 115 mg/dL — ABNORMAL HIGH (ref 70–99)
Glucose-Capillary: 128 mg/dL — ABNORMAL HIGH (ref 70–99)
Glucose-Capillary: 133 mg/dL — ABNORMAL HIGH (ref 70–99)
Glucose-Capillary: 135 mg/dL — ABNORMAL HIGH (ref 70–99)
Glucose-Capillary: 137 mg/dL — ABNORMAL HIGH (ref 70–99)
Glucose-Capillary: 147 mg/dL — ABNORMAL HIGH (ref 70–99)
Glucose-Capillary: 158 mg/dL — ABNORMAL HIGH (ref 70–99)
Glucose-Capillary: 167 mg/dL — ABNORMAL HIGH (ref 70–99)
Glucose-Capillary: 168 mg/dL — ABNORMAL HIGH (ref 70–99)
Glucose-Capillary: 95 mg/dL (ref 70–99)

## 2011-02-21 LAB — HEMOGLOBIN AND HEMATOCRIT, BLOOD: HCT: 32.3 % — ABNORMAL LOW (ref 39.0–52.0)

## 2011-02-21 LAB — CARDIAC PANEL(CRET KIN+CKTOT+MB+TROPI)
CK, MB: 1.9 ng/mL (ref 0.3–4.0)
Total CK: 340 U/L — ABNORMAL HIGH (ref 7–232)

## 2011-02-21 LAB — PROTIME-INR: Prothrombin Time: 15.2 seconds (ref 11.6–15.2)

## 2011-02-21 LAB — CK TOTAL AND CKMB (NOT AT ARMC): Relative Index: 0.5 (ref 0.0–2.5)

## 2011-02-21 LAB — TROPONIN I: Troponin I: 0.03 ng/mL (ref 0.00–0.06)

## 2011-02-21 LAB — MAGNESIUM: Magnesium: 1 mg/dL — ABNORMAL LOW (ref 1.5–2.5)

## 2011-02-22 LAB — BASIC METABOLIC PANEL
BUN: 21 mg/dL (ref 6–23)
BUN: 19 mg/dL (ref 6–23)
Calcium: 8.3 mg/dL — ABNORMAL LOW (ref 8.4–10.5)
Calcium: 9.8 mg/dL (ref 8.4–10.5)
Calcium: 9.3 mg/dL (ref 8.4–10.5)
Chloride: 102 mEq/L (ref 96–112)
Chloride: 106 mEq/L (ref 96–112)
Creatinine, Ser: 1.04 mg/dL (ref 0.4–1.5)
Creatinine, Ser: 1.27 mg/dL (ref 0.4–1.5)
Creatinine, Ser: 1.27 mg/dL (ref 0.4–1.5)
Creatinine, Ser: 1.3 mg/dL (ref 0.4–1.5)
GFR calc Af Amer: >60 mL/min (ref >60)
GFR calc Af Amer: >60 mL/min (ref >60)
GFR calc Af Amer: >60 mL/min (ref >60)
GFR calc Af Amer: >60 mL/min (ref >60)
GFR calc non Af Amer: 56 mL/min — ABNORMAL LOW (ref >60)
GFR calc non Af Amer: >60 mL/min (ref >60)
GFR calc non Af Amer: >60 mL/min (ref >60)
GFR calc non Af Amer: 54 mL/min — ABNORMAL LOW (ref >60)
GFR calc non Af Amer: 56 mL/min — ABNORMAL LOW (ref >60)
Glucose, Bld: 127 mg/dL — ABNORMAL HIGH (ref 70–99)
Potassium: 5.3 mEq/L — ABNORMAL HIGH (ref 3.5–5.1)
Potassium: 4 mEq/L (ref 3.5–5.1)
Sodium: 135 mEq/L (ref 135–145)
Sodium: 138 mEq/L (ref 135–145)
Sodium: 138 mEq/L (ref 135–145)

## 2011-02-22 LAB — PROTIME-INR
INR: 1.1 (ref 0.00–1.49)
INR: 1.2 (ref 0.00–1.49)
INR: 1.5 (ref 0.00–1.49)
INR: 1.6 — ABNORMAL HIGH (ref 0.00–1.49)
Prothrombin Time: 14.9 seconds (ref 11.6–15.2)
Prothrombin Time: 15.8 seconds — ABNORMAL HIGH (ref 11.6–15.2)
Prothrombin Time: 18.4 seconds — ABNORMAL HIGH (ref 11.6–15.2)
Prothrombin Time: 19.8 seconds — ABNORMAL HIGH (ref 11.6–15.2)

## 2011-02-22 LAB — CBC
HCT: 24.5 % — ABNORMAL LOW (ref 39.0–52.0)
HCT: 25 % — ABNORMAL LOW (ref 39.0–52.0)
Hemoglobin: 8.2 g/dL — ABNORMAL LOW (ref 13.0–17.0)
Hemoglobin: 7.8 g/dL — CL (ref 13.0–17.0)
MCV: 86.7 fL (ref 78.0–100.0)
MCV: 86.8 fL (ref 78.0–100.0)
MCV: 83.9 fL (ref 78.0–100.0)
Platelets: 153 10*3/uL (ref 150–400)
Platelets: 156 10*3/uL (ref 150–400)
Platelets: 209 10*3/uL (ref 150–400)
RBC: 2.8 MIL/uL — ABNORMAL LOW (ref 4.22–5.81)
RBC: 2.75 MIL/uL — ABNORMAL LOW (ref 4.22–5.81)
RBC: 2.79 MIL/uL — ABNORMAL LOW (ref 4.22–5.81)
WBC: 4.6 10*3/uL (ref 4.0–10.5)
WBC: 5.8 10*3/uL (ref 4.0–10.5)
WBC: 6 10*3/uL (ref 4.0–10.5)
WBC: 4 10*3/uL (ref 4.0–10.5)
WBC: 5.1 10*3/uL (ref 4.0–10.5)

## 2011-02-22 LAB — TYPE AND SCREEN: Antibody Screen: NEGATIVE

## 2011-02-22 LAB — DIFFERENTIAL
Basophils Relative: 1 % (ref 0–1)
Eosinophils Absolute: 0 10*3/uL (ref 0.0–0.7)
Eosinophils Absolute: 0.1 10*3/uL (ref 0.0–0.7)
Eosinophils Absolute: 0.1 10*3/uL (ref 0.0–0.7)
Eosinophils Relative: 1 % (ref 0–5)
Lymphocytes Relative: 21 % (ref 12–46)
Lymphocytes Relative: 24 % (ref 12–46)
Lymphs Abs: 0.8 10*3/uL (ref 0.7–4.0)
Lymphs Abs: 0.9 10*3/uL (ref 0.7–4.0)
Lymphs Abs: 0.9 10*3/uL (ref 0.7–4.0)
Lymphs Abs: 1.1 10*3/uL (ref 0.7–4.0)
Monocytes Absolute: 0.5 10*3/uL (ref 0.1–1.0)
Monocytes Absolute: 0.5 10*3/uL (ref 0.1–1.0)
Monocytes Relative: 10 % (ref 3–12)
Monocytes Relative: 13 % — ABNORMAL HIGH (ref 3–12)
Neutro Abs: 3.3 10*3/uL (ref 1.7–7.7)
Neutro Abs: 3.4 10*3/uL (ref 1.7–7.7)
Neutrophils Relative %: 72 % (ref 43–77)
Neutrophils Relative %: 62 % (ref 43–77)
Neutrophils Relative %: 67 % (ref 43–77)

## 2011-02-22 LAB — CROSSMATCH: Antibody Screen: NEGATIVE

## 2011-02-22 LAB — RETICULOCYTES: Retic Count, Absolute: 32.4 10*3/uL (ref 19.0–186.0)

## 2011-02-22 LAB — URINALYSIS, ROUTINE W REFLEX MICROSCOPIC
Ketones, ur: NEGATIVE mg/dL
Leukocytes, UA: NEGATIVE
Nitrite: NEGATIVE
Protein, ur: 30 mg/dL — AB
pH: 5.5 (ref 5.0–8.0)

## 2011-02-22 LAB — POCT I-STAT, CHEM 8
BUN: 26 mg/dL — ABNORMAL HIGH (ref 6–23)
Calcium, Ion: 1.25 mmol/L (ref 1.12–1.32)
Chloride: 104 mEq/L (ref 96–112)
Glucose, Bld: 157 mg/dL — ABNORMAL HIGH (ref 70–99)
HCT: 27 % — ABNORMAL LOW (ref 39.0–52.0)
Potassium: 4.3 mEq/L (ref 3.5–5.1)

## 2011-02-22 LAB — CARDIAC PANEL(CRET KIN+CKTOT+MB+TROPI)
CK, MB: 1.8 ng/mL (ref 0.3–4.0)
Relative Index: 0.9 (ref 0.0–2.5)
Relative Index: 1 (ref 0.0–2.5)
Total CK: 200 U/L (ref 7–232)
Troponin I: 0.01 ng/mL (ref 0.00–0.06)

## 2011-02-22 LAB — GLUCOSE, CAPILLARY
Glucose-Capillary: 103 mg/dL — ABNORMAL HIGH (ref 70–99)
Glucose-Capillary: 108 mg/dL — ABNORMAL HIGH (ref 70–99)
Glucose-Capillary: 111 mg/dL — ABNORMAL HIGH (ref 70–99)
Glucose-Capillary: 145 mg/dL — ABNORMAL HIGH (ref 70–99)

## 2011-02-22 LAB — URINE MICROSCOPIC-ADD ON

## 2011-02-22 LAB — CK TOTAL AND CKMB (NOT AT ARMC)
CK, MB: 1.5 ng/mL (ref 0.3–4.0)
Total CK: 203 U/L (ref 7–232)

## 2011-02-22 LAB — LIPID PANEL
HDL: 60 mg/dL (ref >39)
HDL: 60 mg/dL (ref >39)
LDL Cholesterol: 68 mg/dL (ref 0–99)
Total CHOL/HDL Ratio: 2.3 RATIO
Triglycerides: 49 mg/dL (ref <150)
VLDL: 10 mg/dL (ref 0–40)
VLDL: 10 mg/dL (ref 0–40)

## 2011-02-22 LAB — FOLATE: Folate: 19.5 ng/mL

## 2011-02-22 LAB — HEPARIN LEVEL (UNFRACTIONATED): Heparin Unfractionated: 0.31 IU/mL (ref 0.30–0.70)

## 2011-02-22 LAB — MAGNESIUM: Magnesium: 1.6 mg/dL (ref 1.5–2.5)

## 2011-02-22 LAB — HEMOGLOBIN A1C
Hgb A1c MFr Bld: 7.1 % — ABNORMAL HIGH (ref 4.6–6.1)
Mean Plasma Glucose: 131 mg/dL
Mean Plasma Glucose: 157 mg/dL

## 2011-02-22 LAB — IRON AND TIBC: Saturation Ratios: 8 % — ABNORMAL LOW (ref 20–55)

## 2011-02-22 LAB — HEMOGLOBIN AND HEMATOCRIT, BLOOD: HCT: 26.3 % — ABNORMAL LOW (ref 39.0–52.0)

## 2011-02-22 LAB — VITAMIN B12: Vitamin B-12: 296 pg/mL (ref 211–911)

## 2011-02-22 LAB — PHOSPHORUS: Phosphorus: 3.5 mg/dL (ref 2.3–4.6)

## 2011-02-22 LAB — APTT: aPTT: 37 seconds (ref 24–37)

## 2011-02-23 LAB — GLUCOSE, CAPILLARY: Glucose-Capillary: 137 mg/dL — ABNORMAL HIGH (ref 70–99)

## 2011-02-24 LAB — CBC
MCHC: 33.7 g/dL (ref 30.0–36.0)
MCV: 89.1 fL (ref 78.0–100.0)
Platelets: 202 10*3/uL (ref 150–400)

## 2011-02-24 LAB — DIFFERENTIAL
Basophils Relative: 0 % (ref 0–1)
Eosinophils Absolute: 0.1 10*3/uL (ref 0.0–0.7)
Neutrophils Relative %: 71 % (ref 43–77)

## 2011-02-24 LAB — PROTIME-INR
INR: 1.7 — ABNORMAL HIGH (ref 0.00–1.49)
Prothrombin Time: 20.7 seconds — ABNORMAL HIGH (ref 11.6–15.2)

## 2011-02-24 LAB — BASIC METABOLIC PANEL
BUN: 16 mg/dL (ref 6–23)
CO2: 27 mEq/L (ref 19–32)
Chloride: 100 mEq/L (ref 96–112)
Creatinine, Ser: 1.22 mg/dL (ref 0.4–1.5)

## 2011-02-28 LAB — CROSSMATCH
ABO/RH(D): O POS
Antibody Screen: NEGATIVE

## 2011-02-28 LAB — GLUCOSE, CAPILLARY
Glucose-Capillary: 100 mg/dL — ABNORMAL HIGH (ref 70–99)
Glucose-Capillary: 108 mg/dL — ABNORMAL HIGH (ref 70–99)
Glucose-Capillary: 115 mg/dL — ABNORMAL HIGH (ref 70–99)
Glucose-Capillary: 116 mg/dL — ABNORMAL HIGH (ref 70–99)
Glucose-Capillary: 117 mg/dL — ABNORMAL HIGH (ref 70–99)
Glucose-Capillary: 122 mg/dL — ABNORMAL HIGH (ref 70–99)
Glucose-Capillary: 124 mg/dL — ABNORMAL HIGH (ref 70–99)
Glucose-Capillary: 125 mg/dL — ABNORMAL HIGH (ref 70–99)
Glucose-Capillary: 128 mg/dL — ABNORMAL HIGH (ref 70–99)
Glucose-Capillary: 134 mg/dL — ABNORMAL HIGH (ref 70–99)
Glucose-Capillary: 141 mg/dL — ABNORMAL HIGH (ref 70–99)

## 2011-02-28 LAB — ABO/RH: ABO/RH(D): O POS

## 2011-02-28 LAB — PROTIME-INR
INR: 2.8 — ABNORMAL HIGH (ref 0.00–1.49)
Prothrombin Time: 31.2 seconds — ABNORMAL HIGH (ref 11.6–15.2)

## 2011-03-29 NOTE — Consult Note (Signed)
[name], [name] NO.:  [id number]   MEDICAL RECORD NO.:  [id number]          PATIENT TYPE:  INP   LOCATION:  0101                         FACILITY:  [facility]   PHYSICIAN:  Madlyn Frankel. Charlann Boxer, M.D.  DATE OF BIRTH:  [birth year]   DATE OF CONSULTATION:  05/03/2009  DATE OF DISCHARGE:                                 CONSULTATION   CHIEF COMPLAINT:  Left hip fracture.   SECONDARY DIAGNOSES:  Include:  1. Diabetes, type 2.  2. Hyperlipidemia.  3. Coronary artery disease.  4. Atrial fibrillation.  5. History of vascular disease with history of cerebrovascular      accident on the left side.  6. History of peripheral vascular disease.  7. Reflux disease.  8. History of a right above-the-knee amputation.   BRIEF HISTORY:  [name] is a 76 year old male currently a resident at  [facility] where he has been receiving some physical therapy  for mobility and transfer purposes.  He states his otherwise activity is  doing some physical therapy and otherwise mobility issues for with a  wheelchair.  Apparently, he does have a prosthetic limb for his right  lower extremity but apparently has been falling due to inability of full  function on this extremity at this date.  Apparently, there have been  reports of a couple of falls, there must have been a fall over the last  24 hours for which he was transferred to the [facility] Emergency Room  for evaluation.  Radiographs revealed femoral neck fracture.  Orthopedics was consulted for evaluation of the fracture.  At that time,  he reported no evidence of any shortness of breath, blacking out  episodes, or chest pain.   PAST MEDICAL HISTORY:  As noted above, includes:  1. Diabetes, type 2.  2. Hyperlipidemia.  3. Coronary artery disease.  4. Atrial fibrillation.  5. History of peripheral and carotid vascular disease.  6. History of reflux disease.   PAST SURGICAL HISTORY:  Includes:  1. Lower extremity  amputation including a right above-the-knee      amputation.  2. A previous stent placement in his right carotid.   CURRENT MEDICATIONS:  Reviewed from [facility] indicate:  1. Actos 45 mg q.a.m.  2. Lipitor 80 mg q.a.m.  3. Metformin 500 mg p.o. b.i.d.  4. Iron p.o. t.i.d.  5. Protonix 40 mg b.i.d.  6. Plavix 75 mg daily.  7. Aspirin 325 mg daily.  8. Humalog as needed with an insulin sliding scale with his CBGs      performed b.i.d.   SOCIAL HISTORY:  Again, he is a current resident at [facility].  His daughter is acting as his power of attorney.   EXAMINATION:  [name] is awake and alert and cooperative.  He is  present today with a single family member.  At the time of my  evaluation, he is supine on the bed.  He does have a right, healed,  above-the-knee amputation.  Movement of the left lower extremity  produces pain in the groin area.  He is  otherwise limited in his  mobility to pain at this point.  He has no signs of any significant  venous or vascular compromise in his left lower extremity at this point.   The general medical exam will be deferred to their history and physical  upon admission.   RADIOGRAPHS:  Patient had an AP pelvis, AP and lateral, left hip,  indicating a basocervical femoral neck fracture.  He is also noted to  have severe bilateral hip osteoarthritis.   ASSESSMENT:  Left hip basocervical femoral neck fracture in the setting  of severe arthritis and multiple medical problems.   PLAN:  I had a chance to review with [name] and family his current  situation.  Based on his presentation, his radiographic findings, his  overall medical conditions, it is my recommendation at this point to  proceed with a closed reduction percutaneous cannulated screw fixation.  It is an extracapsular fracture that has an ability to heal.  Given his  limited overall mobility, fracture healing would get him back to a  baseline mobility  status.   Ultimately, hip replacement would be the treatment of choice, however,  given his increased risks of procedure, increased risk of postoperative  complication, we will defer this to a need down the road.   I reviewed with him and his family failure of fixation, nonunion, cut  out of hardware, infection, otherwise would lead to future surgeries,  however, at this point our goal is to stabilize fracture to allow for  healing and return to normal function.  Questions were encouraged and  interviewed at the time of the consultation.  Consent will be obtained  for surgery based on this discussion.      Madlyn Frankel Charlann Boxer, M.D.  Electronically Signed     MDO/MEDQ  D:  05/03/2009  T:  05/03/2009  Job:  [id number]

## 2011-03-29 NOTE — H&P (Signed)
NAMEVICKEY, EWBANK NO.:  [id number]   MEDICAL RECORD NO.:  [id number]          PATIENT TYPE:  INP   LOCATION:  4705                         FACILITY:  [facility]   PHYSICIAN:  Monte Fantasia, MD  DATE OF BIRTH:  [birth year]   DATE OF ADMISSION:  03/20/2009  DATE OF DISCHARGE:                              HISTORY & PHYSICAL   PRIMARY CARE PHYSICIAN:  Alfonse Alpers. Dagoberto Ligas, MD.   NEUROLOGIST:  Dr. [name]   CHIEF COMPLAINT:  The patient was sent in from the nursing facility for  low blood count.  The patient was here in the short stay today for  internal carotid artery stenting and was found to have low hemoglobin of  7.8 and hence the patient was called in for admission in view of the  same.  The patient denies any complaints of chest pains, nausea,  vomiting, dizziness, headache, shortness of breath, abdominal pains,  diarrhea, melena, or hematemesis.  No history of bowel or bladder  complaints.  No history of pain on passing urine, complaints of fevers.  As per the patient's family has been stated that his hemoglobin has been  low and had undergone colonoscopy few weeks back with Dr. Arlyce Dice and was  told that it was fine.  Also, the patient had undergone a cardiac cath  with [facility] and was told that he did not have any problems  with his heart.   REVIEW OF SYSTEMS:  A 12-point systems have been done, are negative  other than those mentioned in the HPI.   PAST MEDICAL HISTORY:  Hypertension, diabetes, stroke, hyperlipidemia,  and coronary artery disease.   SURGICAL HISTORY:  Status post amputation of his left leg, which is  above knee.  Next, above-knee amputation of his right leg, multiple  scars along his left thigh, and the leg with evidence of bypass done for  his peripheral vascular disease.   SOCIAL HISTORY:  Nonsmoker, nondrinker.  No history of IV drug use.   FAMILY HISTORY:  Noncontributory.   MEDICATIONS:  1. Actos 45 mg once  daily.  2. Coumadin 5 mg once daily.  3. Lipitor 80 mg p.o. daily.  4. Metformin 500 mg p.o. b.i.d.  5. Aspirin 81 mg p.o. daily.   PHYSICAL EXAMINATION:  VITAL SIGNS:  Temperature of 97.0, heart rate of  95, blood pressure of 131/62, respiratory rate of 18.  GENERAL:  The patient is comfortable sitting up in bed and no evidence  of any respiratory distress.  HEENT:  Neck is supple.  Pupils equal, reacting to light.  No pallor.  No JVD.  No carotid bruits.  No icterus.  RESPIRATORY:  Air entry is bilaterally equal.  No rales or rhonchi.  CARDIOVASCULAR:  S1 and S2, regular rate and rhythm.  ABDOMEN:  Soft.  No guarding, rigidity, no tenderness.  EXTREMITIES:  Right above-knee amputation.  Left leg scars of surgery  plus pedal pulses felt.  No edema.  CNS:  The patient is alert, awake, oriented x3.  No focal neurological  deficits.  SKIN:  Intact.  No evidence of any skin  breakdown or rashes.   Radiological investigations done and during the stay in the hospital,  chest x-ray done on Mar 20, 2009,  Impression:  Emphysema without any acute disease.   LABORATORY DATA:  On admission, total WBC 5.1, hemoglobin 7.8,  hematocrit 23.5, platelets of 205.  UA is negative, nitrite negative.  Wbc's 0-2, rbc's 0-2.  Fecal occult blood is negative.  Sodium 139,  potassium 4.3, chloride 104, BUN 26, creatinine 1.4, glucose 157,  calcium ionized 1.25.  Prothrombin time of 18.4, and INR of 1.5.   ASSESSMENT AND PLAN:  1. Symptomatic anemia.  2. Anemia of chronic disease.  3. Severe peripheral vascular disease.  4. Hypertension.  5. Status post left BKA.  6. Diabetes type 2, which is controlled.  7. History of stroke.  8. Hyperlipidemia.   PLAN:  1. To admit the patient to telemetry bed with type and cross 40 units,      monitor his H and H q.8 h.  Transfuse him 1 unit, would check      anemia panel prior to transfusion.  We would also check for his      cardiac enzymes.  2. We would  repeat his CBC in a.m.  The patient has been on Coumadin.      Hold Coumadin for now.  Also, would contact the [employer] for      colonoscopy reports.  3. In view of his diabetes, we would continue his Actos and metformin.      We would monitor his sugars q.a.c. nightly, and keep him on sliding      scale NovoLog insulin.  4. In view of his deep venous thrombosis and GI prophylaxis, the      patient is on Protonix and SCDs.  5. The patient will possibly be discharged in next 24-48 hours.   TOTAL TIME FOR DICTATION:  Forty minutes.   TOTAL TIME FOR ADMISSION:  Forty minutes.      Monte Fantasia, MD  Electronically Signed     MP/MEDQ  D:  03/20/2009  T:  03/21/2009  Job:  [id number]
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
## 2011-03-29 NOTE — Assessment & Plan Note (Signed)
OFFICE VISIT   JAMESEN, STAHNKE  DOB:  04-06-1935                                       12/01/2010  CHART#:[id number]   I saw this patient in the  office today for continued follow-up of his  abdominal aortic aneurysm.  This is a 76 year old gentleman who had  presented with a nonhealing left transmetatarsal amputation.  He  underwent arteriogram which showed he was not a candidate for  revascularization.  He had a patent fem-pop bypass graft on that side.  At the time of his arteriogram, however, it was noted that it appeared  that he had a small saccular aneurysm.  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  He comes in for a follow-up study.  Since I saw him last,  he has had no  abdominal or back pain.   PAST MEDICAL HISTORY:  Significant for type 2 diabetes, hypertension,  hypercholesterolemia, coronary artery disease, all of which been stable  with no recent problems.   SOCIAL HISTORY:  He is widowed.  He has 1 child.  He quit tobacco in  2007.   REVIEW OF SYSTEMS:  CARDIOVASCULAR:  He had no chest pain, chest  pressure, palpitations or arrhythmias.  PULMONARY:  He had no productive cough, bronchitis, asthma or wheezing.   PHYSICAL EXAMINATION:  This is a pleasant 76 year old gentleman who  appears his stated age.  Blood pressure 184/82, heart rate is 71,  respiratory rate is 20.  Lungs:  Clear bilaterally to auscultation.  Cardiovascular:  He has a right carotid bruit.  He has a regular rate  and rhythm.  Both amputation sites appear adequately perfused.  Abdomen:  Soft and nontender.  I am unable to palpate his aneurysm.  Musculoskeletal:  He has bilateral AKAs.  Neurologic: He has no focal  weakness or paresthesias.   I have reviewed his CAT scan from today which shows that there is no  change in the size of his infrarenal aneurysm which is fairly focal and  measures 4.5 cm in maximum  diameter.  He has mildly dilated iliacs.  He  has marked calcific disease of his entire aorto-iliac system and would  be a poor candidate for surgery.   Given that the aneurysm remains stable, I have recommend that we do a  follow-up ultrasound in 6 months and hopefully we can limit how many CAT  scans we need to do.  In additional, will get a carotid duplex scan and  workup his right carotid bruit.  I will see him back in 6 months.  He  knows to call sooner if he has problems.     Di Kindle. Edilia Bo, M.D.  Electronically Signed   CSD/MEDQ  D:  12/01/2010  T:  12/02/2010  Job:  [id number]
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
## 2011-03-29 NOTE — Discharge Summary (Signed)
NAMEAVAN, [name] NO.:  [id number]   MEDICAL RECORD NO.:  [id number]          PATIENT TYPE:  INP   LOCATION:  1441                         FACILITY[facility]  [facility]   PHYSICIAN:  Eduard Clos, MDDATE OF BIRTH:  22-May-1935   DATE OF ADMISSION:  05/03/2009  DATE OF DISCHARGE:  05/07/2009                               DISCHARGE SUMMARY   HOSPITAL COURSE:  A 76 year old male with known history of diabetes  mellitus type 2, CAD, history of CVA status post right internal carotid  artery stenting on Plavix.  The patient is complaining of having a fall  and subsequently was found to have a left femoral neck fracture and  orthopedics were consulted.  The patient underwent a left ORIF and after  surgery the patient had acute blood loss anemia.  Hemoglobin had fallen  from 10 to 7.8 and after transfusion hemoglobin stabilized around 9.6.  At this time as the patient condition is stable, we transferred back to  skilled nursing facility for which a social worker is in contact with  family and will be discharged once the facility is agreed upon.   PROCEDURE:  Left ORIF on May 03, 2009.   RADIOLOGY:  1. Chest x-ray on May 03, 2009 showed no active cardiopulmonary      disease.  2. Chest x-ray on May 03, 2009 showed small right pleural effusion,      proximal enlargement of pulmonary arteries consistent with      pulmonary artery hypertension, question right thoracotomy.  3. Hip x-ray on May 03, 2009 showed left femoral neck fracture      extending from basicervical laterally to intertrochanteric      medially, severe bilateral hip osteoarthritis with remodeling of      both acetabular osteopenia.  4. Repeat x-ray of the left hip on May 03, 2009 status post surgery      showed ORIF of hip fracture.   PERTINENT LABS:  The patient's hemoglobin had fallen from 10 to 7.8 and  after transfusion it has been stable around 9.6.  Cardiac enzymes are  all negative.  The  patient's creatinine on discharge is 1.   FINAL DIAGNOSES:  1. Left hip fracture, status post fall, presently status post left      open reduction and internal fixation.  2. Acute blood loss anemia secondary to surgery, stable hemoglobin and      hematocrit.  3. Diabetes mellitus type 2.  4. Hypertension.  5. Previous cerebrovascular accident.  6. Peripheral vascular disease.   DISCHARGE MEDICATIONS:  1. Actos 45 mg p.o. daily.  2. Lipitor 80 mg p.o. daily.  3. Metformin 500 mg p.o. b.i.d.  4. Ferrous sulfate 325 mg p.o. three times daily.  5. Protonix 40 mg twice daily.  6. Plavix 75 mg p.o. daily.  7. Aspirin 325 mg p.o. daily.  8. Humalog sliding scale 151-200 give 2 units subcutaneous, 201 to 250      give 4 units subcutaneous, 251-300 give 6 units subcutaneous, 301-      350 give 8 units subcutaneous, 351- 400 give 10 units  subcutaneous.      The sliding scale is premixed t.i.d.  9. Vicodin 5/500 mg one to two tablets p.o. q.6 hours p.r.n. for pain.   PLAN:  The patient is to be transferred to a skilled nursing facility to  follow up with his primary care Minami Arriaga within a weeks' time, to follow  with Dr. __________ in two weeks' time as scheduled.   ACTIVITY:  As tolerated with physical therapy.  The patient is to follow  a cardiac healthy carb-modified diet to check blood sugar a.c. and h.s.  with precautions about hypoglycemia.  Recheck BMET and CBC within 3 days  of transfer to skilled nursing facility.      Eduard Clos, MD  Electronically Signed     ANK/MEDQ  D:  05/07/2009  T:  05/07/2009  Job:  [id number]
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
## 2011-03-29 NOTE — Discharge Summary (Signed)
[name], [name] NO.:  [id number]   MEDICAL RECORD NO.:  [id number]          PATIENT TYPE:  INP   LOCATION:  4705                         FACILITY:  [facility]   PHYSICIAN:  Renee Ramus, MD       DATE OF BIRTH:  Nov 13, 1935   DATE OF ADMISSION:  03/20/2009  DATE OF DISCHARGE:                               DISCHARGE SUMMARY   PRIMARY DISCHARGE DIAGNOSIS:  Anemia from iron deficiency.   SECONDARY DIAGNOSES:  1. Peripheral vascular disease status post recent carotid      endarterectomy.  2. Atrial fibrillation.  3. Coronary artery disease.  4. Type 2 diabetes mellitus.  5. Hyperlipidemia.  6. Hypertension.   HOSPITAL COURSE BY PROBLEM:  1. Anemia.  The patient is a 76 year old male who was sent from the      skilled nursing facility to the emergency department cause of low      blood count.  The patient had a hemoglobin of 7.8.  The patient has      recently had a colonoscopy.  He has had guaiac-negative stools.  He      did have iron studies done and they showed iron deficiency.  The      patient will be put on iron supplements.  He received 1 unit of      packed RBCs and his hemoglobin and hematocrit have remained stable.      The patient now being discharged back to skilled nursing facility      for subacute rehab.  It looks like on December 21 the patient's      hemoglobin was 11.6, then on March 12 he was 8.4.  We are at this      time believing that this is simply due to decrease of dietary iron  2. Status post recent CEA.  This is stable.  The patient will continue      with aspirin therapy.  3. Atrial fibrillation.  The patient is under rate control as well as      Coumadin anticoagulation.  We are going to hold his anticoagulation      for 1 week and then restart with therapeutic INR between 2 and 3.      We believe that the risk of stroke during this time is less than      the risk of developing a serious bleed in the face of preexisting  anemia.  4. Coronary artery disease.  We will continue aspirin therapy.  5. Type 2 diabetes.  We will continue metformin.  The patient did have      a hemoglobin A1c of 7.0.  6. Hyperlipidemia.  Is currently stable.  The patient will continue      statin therapy.  7. Hypertension.  This is also well controlled.  8. Past history of stroke.  The patient is on antiplatelet therapy and      statin therapy and his blood pressure is well controlled.   LABS OF NOTE:  1. Anemia with hemoglobin of 9.2, currently 9.4 and hematocrit 28.  2. Reticulocyte count of 1.2 which is  below expected.  3. INR of 1.5.  4. BUN at 21 with creatinine of 1.27, which appears to fit his      historic levels.  5. Hemoglobin A1c of 7.1.  6. Cholesterol panel showing total cholesterol 138 with an LDL of 68      and an HDL of 60.  7. Iron levels of 28 with a ferritin of 18 consistent with severe iron-      deficiency anemia.  8. UA showing slightly concentrated urine with mild protein.  9. Fecal occult blood studies which are negative.   STUDIES:  Chest x-ray showing no acute disease.   MEDICATIONS ON DISCHARGE:  1. Actos 45 mg p.o. q.a.m.  2. Coumadin 5 mg p.o. q.a.m. which we are holding for 1 week then      restart.  3. Lipitor 80 mg p.o. q.a.m.  4. Metformin 500 mg p.o. b.i.d.  5. Aspirin 81 mg p.o. daily.  6. Iron sulfate 325 mg 1 p.o. t.i.d.   There are no labs or studies pending at time of discharge.  The patient  is in stable condition and anxious for discharge.   Time spent:  35 minutes.      Renee Ramus, MD  Electronically Signed     JF/MEDQ  D:  03/21/2009  T:  03/21/2009  Job:  [id number]   cc:   Alfonse Alpers. Dagoberto Ligas, M.D.
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
## 2011-03-29 NOTE — Assessment & Plan Note (Signed)
OFFICE VISIT   [name], [name]  DOB:  1934/12/13                                       07/14/2010  QMVHQ#:[id number]   The patient has presented with a dry gangrene of a transmetatarsal  amputation.  There were no options for revascularization, and he  underwent a left above-the-knee amputation on 06/01/2010.  He comes in  today for staple removal.   PHYSICAL EXAMINATION:  Temperature is 98.4, blood pressure 132/79, heart  rate is 50.  His amputation site has healed nicely, and we removed his  staples in the office today.  He also has an above-the-knee amputation  on the right side which is healed.   He is doing well.  I have encouraged him to eat well and will see him  back p.r.n.     Di Kindle. Edilia Bo, M.D.  Electronically Signed   CSD/MEDQ  D:  07/14/2010  T:  07/15/2010  Job:  [id number]
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
## 2011-04-25 ENCOUNTER — Encounter (HOSPITAL_BASED_OUTPATIENT_CLINIC_OR_DEPARTMENT_OTHER): Payer: Self-pay

## 2011-05-06 ENCOUNTER — Ambulatory Visit: Payer: Self-pay

## 2011-05-06 ENCOUNTER — Other Ambulatory Visit: Payer: Self-pay

## 2011-05-16 ENCOUNTER — Encounter (HOSPITAL_BASED_OUTPATIENT_CLINIC_OR_DEPARTMENT_OTHER): Payer: Medicare Other | Attending: Internal Medicine

## 2011-05-16 DIAGNOSIS — Y9210 Unspecified residential institution as the place of occurrence of the external cause: Secondary | ICD-10-CM | POA: Insufficient documentation

## 2011-05-16 DIAGNOSIS — I714 Abdominal aortic aneurysm, without rupture, unspecified: Secondary | ICD-10-CM | POA: Insufficient documentation

## 2011-05-16 DIAGNOSIS — S61209A Unspecified open wound of unspecified finger without damage to nail, initial encounter: Secondary | ICD-10-CM | POA: Insufficient documentation

## 2011-05-16 DIAGNOSIS — E559 Vitamin D deficiency, unspecified: Secondary | ICD-10-CM | POA: Insufficient documentation

## 2011-05-16 DIAGNOSIS — Z794 Long term (current) use of insulin: Secondary | ICD-10-CM | POA: Insufficient documentation

## 2011-05-16 DIAGNOSIS — Z79899 Other long term (current) drug therapy: Secondary | ICD-10-CM | POA: Insufficient documentation

## 2011-05-16 DIAGNOSIS — F411 Generalized anxiety disorder: Secondary | ICD-10-CM | POA: Insufficient documentation

## 2011-05-16 DIAGNOSIS — W2209XA Striking against other stationary object, initial encounter: Secondary | ICD-10-CM | POA: Insufficient documentation

## 2011-05-16 DIAGNOSIS — E119 Type 2 diabetes mellitus without complications: Secondary | ICD-10-CM | POA: Insufficient documentation

## 2011-05-16 DIAGNOSIS — Z8673 Personal history of transient ischemic attack (TIA), and cerebral infarction without residual deficits: Secondary | ICD-10-CM | POA: Insufficient documentation

## 2011-05-16 DIAGNOSIS — S78119A Complete traumatic amputation at level between unspecified hip and knee, initial encounter: Secondary | ICD-10-CM | POA: Insufficient documentation

## 2011-05-16 DIAGNOSIS — Z7982 Long term (current) use of aspirin: Secondary | ICD-10-CM | POA: Insufficient documentation

## 2011-05-16 DIAGNOSIS — M1990 Unspecified osteoarthritis, unspecified site: Secondary | ICD-10-CM | POA: Insufficient documentation

## 2011-05-16 DIAGNOSIS — I4891 Unspecified atrial fibrillation: Secondary | ICD-10-CM | POA: Insufficient documentation

## 2011-05-17 NOTE — Assessment & Plan Note (Unsigned)
[facility]  NAME:  [name], [name] NO.:  [id number]  MEDICAL RECORD NO.:  [id number]      DATE OF BIRTH:  09-Mar-1935  PHYSICIAN:  Jonelle Sports. Janeisha Ryle, M.D.       VISIT DATE:                                  OFFICE VISIT   HISTORY:  This is a 76 year old black male, is seen with an open wound on his left third finger.  The patient is previously known to this clinic having been here with severe ischemic ulcerations of the lower extremities which have eventually resulted in bilateral lower extremity amputations.  His last visit here was May 18, 2010.  Since that time, he has undergone the amputation of his right lower extremity above the knee and also has had a cerebrovascular accident which has left him mechanically impaired, but mentally still reasonably clear.  He also has chronic atrial fibrillation and known abdominal aortic aneurysm.  Without that background history, he was recently being helped into the shower at a nursing center and somehow and jammed his hand against the wall with an avulsion of the nail of the left third finger.  In watching that, the facility has felt that the bone is exposed and refers him now for our further evaluation and advice.  PAST MEDICAL HISTORY:  Positive for those things indicated in the present illness which relate primarily to his vascular system, but is also positive for type 2 diabetes.  He also has had cataract surgeries, has chronic anxiety disorder.  Has known vitamin D deficiency and osteoarthritis.  He has no known medicinal allergies.  REGULAR MEDICATIONS: 1. Actos 45 mg daily. 2. NovoLog 5 units twice daily before meals. 3. Enteric-coated aspirin one daily. 4. Plavix 75 mg one daily. 5. Colace 100 mg b.i.d. 6. Ferrous sulfate 325 mg daily. 7. Lipitor 80 mg at bedtime. 8. Metoprolol 25 mg daily. 9. Milk of magnesia on a p.r.n. basis. 10.Multiple vitamins daily. 11.Nitroglycerin 0.4  mg p.r.n. 12.Os-Cal 500 mg b.i.d. 13.Vitamin C 500 mg b.i.d. 14.Vitamin D 50,000 units every other week. 15.Zantac 150 mg daily.  PHYSICAL EXAMINATION:  VITAL SIGNS:  Blood pressure is 138/75, pulse is 58 and irregular, respirations are 18 and unlabored, temperature is 98.8. GENERAL:  This is an elderly black male appearing perhaps older than his stated age with bilateral lower extremity amputations.  He is alert and cooperative, but does have some physical impairment in his right upper extremity secondary to an old CVA. SKIN:  Warm and dry. HEENT:  Mucous membranes are moist. CHEST:  Grossly clear. HEART:  Irregular.  No rhythm of atrial fibrillation. ABDOMEN:  Not examined satisfactorily because he is sitting in a wheelchair being a double amputee. EXTREMITIES:  On the right hand third finger, there is evidence of avulsion of the nail with good healing of the nail bed, but with a small area 0.4 x 0.3 x 0.1 cm of apparent exposure of the proximal shaft of the distal phalanx of that finger.  There is no drainage, no odor, no swelling, no gross evidence of infection.  There is a small slough over the surface of this wound.  IMPRESSION:  Traumatic injury to left third finger with avulsion of the nail and exposure of shaft of the  distal phalanx of that finger.  DISPOSITION:  Given the patient's overall condition, I think that certainly a conservative approach is indicated and quite likely will be satisfactory.  Accordingly, we have today debrided the wound of the small amount of slough in its base following which the wound is cultured and then treated with an application of Oasis.  This is covered with an appropriate nonstick drip pad and a finger wrap.  The receiving facility is requested not to change the patient's dressing, but to keep it clean and dry reinforcing if necessary, and otherwise to return him here for further evaluation in 1 week.  Should there are any concerns in  the interim, they were advised that they can certainly contact us as necessary.          ______________________________ Jonelle Sports Cheryll Cockayne, M.D.     RES/MEDQ  D:  05/16/2011  T:  05/17/2011  Job:  [id number]

## 2011-05-23 ENCOUNTER — Encounter (HOSPITAL_BASED_OUTPATIENT_CLINIC_OR_DEPARTMENT_OTHER): Payer: Self-pay

## 2011-06-22 ENCOUNTER — Ambulatory Visit: Payer: Self-pay | Admitting: Vascular Surgery

## 2011-06-22 ENCOUNTER — Other Ambulatory Visit: Payer: Self-pay

## 2011-07-01 ENCOUNTER — Encounter: Payer: Self-pay | Admitting: Vascular Surgery

## 2011-07-04 ENCOUNTER — Encounter (HOSPITAL_BASED_OUTPATIENT_CLINIC_OR_DEPARTMENT_OTHER): Payer: Medicare Other | Attending: Internal Medicine

## 2011-07-04 DIAGNOSIS — E119 Type 2 diabetes mellitus without complications: Secondary | ICD-10-CM | POA: Insufficient documentation

## 2011-07-04 DIAGNOSIS — Z794 Long term (current) use of insulin: Secondary | ICD-10-CM | POA: Insufficient documentation

## 2011-07-04 DIAGNOSIS — Y9210 Unspecified residential institution as the place of occurrence of the external cause: Secondary | ICD-10-CM | POA: Insufficient documentation

## 2011-07-04 DIAGNOSIS — Z8673 Personal history of transient ischemic attack (TIA), and cerebral infarction without residual deficits: Secondary | ICD-10-CM | POA: Insufficient documentation

## 2011-07-04 DIAGNOSIS — I714 Abdominal aortic aneurysm, without rupture, unspecified: Secondary | ICD-10-CM | POA: Insufficient documentation

## 2011-07-04 DIAGNOSIS — Z79899 Other long term (current) drug therapy: Secondary | ICD-10-CM | POA: Insufficient documentation

## 2011-07-04 DIAGNOSIS — E559 Vitamin D deficiency, unspecified: Secondary | ICD-10-CM | POA: Insufficient documentation

## 2011-07-04 DIAGNOSIS — S61209A Unspecified open wound of unspecified finger without damage to nail, initial encounter: Secondary | ICD-10-CM | POA: Insufficient documentation

## 2011-07-04 DIAGNOSIS — Z7982 Long term (current) use of aspirin: Secondary | ICD-10-CM | POA: Insufficient documentation

## 2011-07-04 DIAGNOSIS — M1990 Unspecified osteoarthritis, unspecified site: Secondary | ICD-10-CM | POA: Insufficient documentation

## 2011-07-04 DIAGNOSIS — S78119A Complete traumatic amputation at level between unspecified hip and knee, initial encounter: Secondary | ICD-10-CM | POA: Insufficient documentation

## 2011-07-04 DIAGNOSIS — I4891 Unspecified atrial fibrillation: Secondary | ICD-10-CM | POA: Insufficient documentation

## 2011-07-04 DIAGNOSIS — F411 Generalized anxiety disorder: Secondary | ICD-10-CM | POA: Insufficient documentation

## 2011-07-04 DIAGNOSIS — W2209XA Striking against other stationary object, initial encounter: Secondary | ICD-10-CM | POA: Insufficient documentation

## 2011-07-19 ENCOUNTER — Encounter (HOSPITAL_BASED_OUTPATIENT_CLINIC_OR_DEPARTMENT_OTHER): Payer: Medicare Other | Attending: Internal Medicine

## 2011-07-19 DIAGNOSIS — S61209A Unspecified open wound of unspecified finger without damage to nail, initial encounter: Secondary | ICD-10-CM | POA: Insufficient documentation

## 2011-07-19 DIAGNOSIS — X58XXXA Exposure to other specified factors, initial encounter: Secondary | ICD-10-CM | POA: Insufficient documentation

## 2011-07-25 ENCOUNTER — Other Ambulatory Visit (HOSPITAL_BASED_OUTPATIENT_CLINIC_OR_DEPARTMENT_OTHER): Payer: Self-pay | Admitting: Internal Medicine

## 2011-07-25 ENCOUNTER — Ambulatory Visit (HOSPITAL_COMMUNITY)
Admission: RE | Admit: 2011-07-25 | Discharge: 2011-07-25 | Disposition: A | Discharge status: Home / Self Care | Payer: Medicare Other | Source: Ambulatory Visit | Attending: Internal Medicine | Admitting: Internal Medicine

## 2011-07-25 DIAGNOSIS — M79609 Pain in unspecified limb: Secondary | ICD-10-CM | POA: Insufficient documentation

## 2011-07-25 DIAGNOSIS — X58XXXA Exposure to other specified factors, initial encounter: Secondary | ICD-10-CM | POA: Insufficient documentation

## 2011-07-25 DIAGNOSIS — S62639A Displaced fracture of distal phalanx of unspecified finger, initial encounter for closed fracture: Secondary | ICD-10-CM | POA: Insufficient documentation

## 2011-07-25 DIAGNOSIS — M79646 Pain in unspecified finger(s): Secondary | ICD-10-CM

## 2011-07-26 ENCOUNTER — Encounter: Payer: Self-pay | Admitting: Vascular Surgery

## 2011-07-27 ENCOUNTER — Ambulatory Visit: Payer: Self-pay | Admitting: Vascular Surgery

## 2011-07-27 ENCOUNTER — Other Ambulatory Visit: Payer: Self-pay

## 2011-08-19 LAB — GLUCOSE, CAPILLARY
Glucose-Capillary: 107 mg/dL — ABNORMAL HIGH (ref 70–99)
Glucose-Capillary: 110 mg/dL — ABNORMAL HIGH (ref 70–99)
Glucose-Capillary: 114 mg/dL — ABNORMAL HIGH (ref 70–99)
Glucose-Capillary: 115 mg/dL — ABNORMAL HIGH (ref 70–99)
Glucose-Capillary: 122 mg/dL — ABNORMAL HIGH (ref 70–99)
Glucose-Capillary: 123 mg/dL — ABNORMAL HIGH (ref 70–99)
Glucose-Capillary: 124 mg/dL — ABNORMAL HIGH (ref 70–99)
Glucose-Capillary: 126 mg/dL — ABNORMAL HIGH (ref 70–99)
Glucose-Capillary: 127 mg/dL — ABNORMAL HIGH (ref 70–99)
Glucose-Capillary: 127 mg/dL — ABNORMAL HIGH (ref 70–99)
Glucose-Capillary: 129 mg/dL — ABNORMAL HIGH (ref 70–99)
Glucose-Capillary: 137 mg/dL — ABNORMAL HIGH (ref 70–99)
Glucose-Capillary: 137 mg/dL — ABNORMAL HIGH (ref 70–99)
Glucose-Capillary: 138 mg/dL — ABNORMAL HIGH (ref 70–99)
Glucose-Capillary: 144 mg/dL — ABNORMAL HIGH (ref 70–99)
Glucose-Capillary: 146 mg/dL — ABNORMAL HIGH (ref 70–99)
Glucose-Capillary: 147 mg/dL — ABNORMAL HIGH (ref 70–99)
Glucose-Capillary: 148 mg/dL — ABNORMAL HIGH (ref 70–99)
Glucose-Capillary: 151 mg/dL — ABNORMAL HIGH (ref 70–99)
Glucose-Capillary: 153 mg/dL — ABNORMAL HIGH (ref 70–99)
Glucose-Capillary: 154 mg/dL — ABNORMAL HIGH (ref 70–99)
Glucose-Capillary: 155 mg/dL — ABNORMAL HIGH (ref 70–99)
Glucose-Capillary: 163 mg/dL — ABNORMAL HIGH (ref 70–99)
Glucose-Capillary: 164 mg/dL — ABNORMAL HIGH (ref 70–99)
Glucose-Capillary: 166 mg/dL — ABNORMAL HIGH (ref 70–99)
Glucose-Capillary: 172 mg/dL — ABNORMAL HIGH (ref 70–99)
Glucose-Capillary: 172 mg/dL — ABNORMAL HIGH (ref 70–99)
Glucose-Capillary: 179 mg/dL — ABNORMAL HIGH (ref 70–99)
Glucose-Capillary: 183 mg/dL — ABNORMAL HIGH (ref 70–99)
Glucose-Capillary: 184 mg/dL — ABNORMAL HIGH (ref 70–99)
Glucose-Capillary: 185 mg/dL — ABNORMAL HIGH (ref 70–99)
Glucose-Capillary: 99 mg/dL (ref 70–99)

## 2011-08-19 LAB — CBC
HCT: 36.3 % — ABNORMAL LOW (ref 39.0–52.0)
Hemoglobin: 11.6 g/dL — ABNORMAL LOW (ref 13.0–17.0)
MCHC: 32.6 g/dL (ref 30.0–36.0)
MCV: 84.5 fL (ref 78.0–100.0)
MCV: 84.7 fL (ref 78.0–100.0)
RBC: 4.19 MIL/uL — ABNORMAL LOW (ref 4.22–5.81)
RBC: 4.22 MIL/uL (ref 4.22–5.81)
RBC: 4.28 MIL/uL (ref 4.22–5.81)
RDW: 18.1 % — ABNORMAL HIGH (ref 11.5–15.5)
WBC: 4.8 10*3/uL (ref 4.0–10.5)
WBC: 5.1 10*3/uL (ref 4.0–10.5)

## 2011-08-19 LAB — URINE CULTURE
Colony Count: 40000
Colony Count: NO GROWTH
Culture: NO GROWTH

## 2011-08-19 LAB — COMPREHENSIVE METABOLIC PANEL
AST: 17 U/L (ref 0–37)
Alkaline Phosphatase: 61 U/L (ref 39–117)
BUN: 11 mg/dL (ref 6–23)
CO2: 26 mEq/L (ref 19–32)
CO2: 28 mEq/L (ref 19–32)
Calcium: 8.9 mg/dL (ref 8.4–10.5)
Chloride: 98 mEq/L (ref 96–112)
Creatinine, Ser: 0.91 mg/dL (ref 0.4–1.5)
Creatinine, Ser: 0.95 mg/dL (ref 0.4–1.5)
GFR calc Af Amer: >60 mL/min (ref >60)
GFR calc non Af Amer: >60 mL/min (ref >60)
Glucose, Bld: 132 mg/dL — ABNORMAL HIGH (ref 70–99)

## 2011-08-19 LAB — DIFFERENTIAL
Basophils Relative: 1 % (ref 0–1)
Eosinophils Absolute: 0.1 10*3/uL (ref 0.0–0.7)
Eosinophils Relative: 1 % (ref 0–5)
Eosinophils Relative: 2 % (ref 0–5)
Lymphocytes Relative: 15 % (ref 12–46)
Lymphs Abs: 0.8 10*3/uL (ref 0.7–4.0)
Lymphs Abs: 1.2 10*3/uL (ref 0.7–4.0)
Monocytes Absolute: 0.6 10*3/uL (ref 0.1–1.0)
Monocytes Relative: 12 % (ref 3–12)
Neutro Abs: 4 10*3/uL (ref 1.7–7.7)
Neutrophils Relative %: 60 % (ref 43–77)
Neutrophils Relative %: 76 % (ref 43–77)

## 2011-08-19 LAB — PROTIME-INR
INR: 1.2 (ref 0.00–1.49)
INR: 2.2 — ABNORMAL HIGH (ref 0.00–1.49)
INR: 2.8 — ABNORMAL HIGH (ref 0.00–1.49)
INR: 3.1 — ABNORMAL HIGH (ref 0.00–1.49)
INR: 3.8 — ABNORMAL HIGH (ref 0.00–1.49)
Prothrombin Time: 14.8 seconds (ref 11.6–15.2)
Prothrombin Time: 26 seconds — ABNORMAL HIGH (ref 11.6–15.2)
Prothrombin Time: 29.2 seconds — ABNORMAL HIGH (ref 11.6–15.2)
Prothrombin Time: 31.2 seconds — ABNORMAL HIGH (ref 11.6–15.2)
Prothrombin Time: 33.6 seconds — ABNORMAL HIGH (ref 11.6–15.2)
Prothrombin Time: 34.5 seconds — ABNORMAL HIGH (ref 11.6–15.2)
Prothrombin Time: 35.3 seconds — ABNORMAL HIGH (ref 11.6–15.2)

## 2011-08-19 LAB — URINALYSIS, ROUTINE W REFLEX MICROSCOPIC
Glucose, UA: 250 mg/dL — AB
Hgb urine dipstick: NEGATIVE
Ketones, ur: NEGATIVE mg/dL
pH: 6.5 (ref 5.0–8.0)

## 2011-08-19 LAB — LIPID PANEL
HDL: 49 mg/dL (ref >39)
Triglycerides: 57 mg/dL (ref <150)
VLDL: 11 mg/dL (ref 0–40)

## 2011-08-19 LAB — BASIC METABOLIC PANEL
Calcium: 9 mg/dL (ref 8.4–10.5)
GFR calc Af Amer: >60 mL/min (ref >60)
GFR calc non Af Amer: >60 mL/min (ref >60)
Glucose, Bld: 137 mg/dL — ABNORMAL HIGH (ref 70–99)
Sodium: 133 mEq/L — ABNORMAL LOW (ref 135–145)

## 2011-08-19 LAB — HEMOGLOBIN A1C
Hgb A1c MFr Bld: 7.2 % — ABNORMAL HIGH (ref 4.6–6.1)
Mean Plasma Glucose: 160 mg/dL

## 2011-08-29 ENCOUNTER — Encounter (HOSPITAL_BASED_OUTPATIENT_CLINIC_OR_DEPARTMENT_OTHER): Payer: Medicare Other | Attending: Internal Medicine

## 2011-08-29 DIAGNOSIS — X58XXXA Exposure to other specified factors, initial encounter: Secondary | ICD-10-CM | POA: Insufficient documentation

## 2011-08-29 DIAGNOSIS — S61209A Unspecified open wound of unspecified finger without damage to nail, initial encounter: Secondary | ICD-10-CM | POA: Insufficient documentation

## 2011-11-24 ENCOUNTER — Other Ambulatory Visit: Payer: Self-pay

## 2011-11-29 ENCOUNTER — Encounter: Payer: Self-pay | Admitting: Vascular Surgery

## 2011-11-30 ENCOUNTER — Other Ambulatory Visit: Payer: Self-pay

## 2011-11-30 ENCOUNTER — Ambulatory Visit: Payer: Self-pay | Admitting: Vascular Surgery

## 2012-01-11 ENCOUNTER — Encounter: Payer: Self-pay | Admitting: Gastroenterology

## 2012-02-13 IMAGING — CT CT ABD-PELV W/ CM
2 of 5 series · 15 of 46 positions shown, 17 images · IV contrast (APPLIED)
Comparison: None.

Addendum Begins

Multiple renal lesion suggestive of a cysts although smaller ones
cannot be adequately confirmed as simple cysts.  No hydronephrosis.
Addendum Ends
CLINICAL DATA: Peripheral vascular disease.  Diabetes.  High blood
pressure.  Status post iliac stenting and femoral popliteal bypass.
Saccular abdominal aortic aneurysm.
CT ABDOMEN AND PELVIS WITH CONTRAST
TECHNIQUE: Multidetector CT imaging of the abdomen and pelvis was
performed following the standard protocol during bolus
administration of intravenous contrast.
Contrast: 100 ml Vmnipaque-CCC.

[Series 2: abd/pelv with 5.0 b31f st · axial · 0.71mm/px · z∈[-507,-127]mm · 12 of 86 slices shown, 14 images]
[im 5/86  soft-tissue]
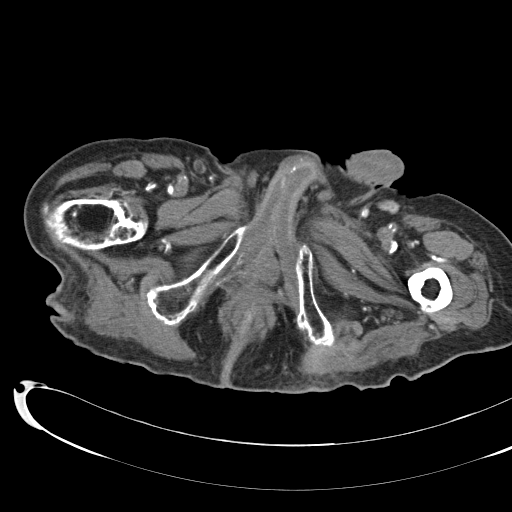
[im 5/86  bone]
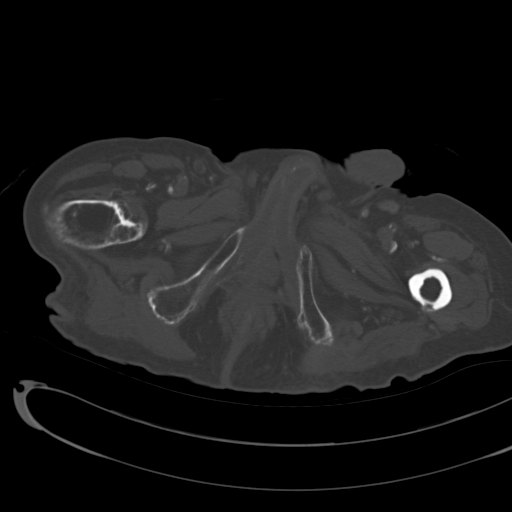
[im 14/86  soft-tissue]
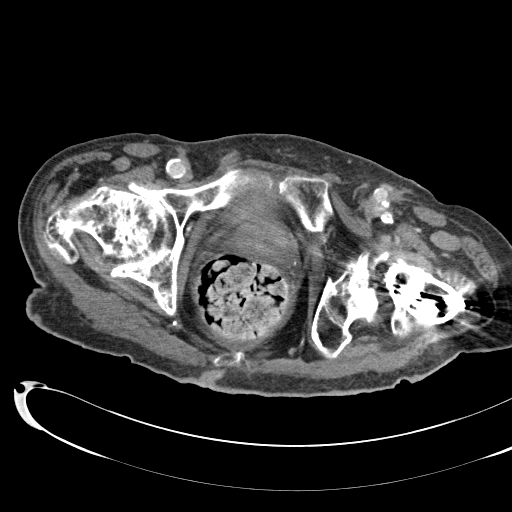
[im 18/86  soft-tissue]
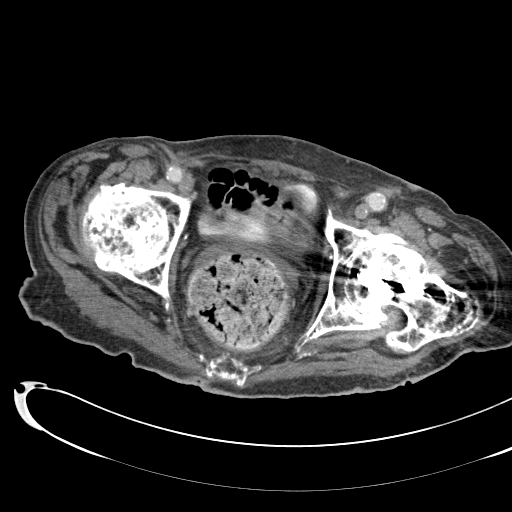
[im 27/86  soft-tissue]
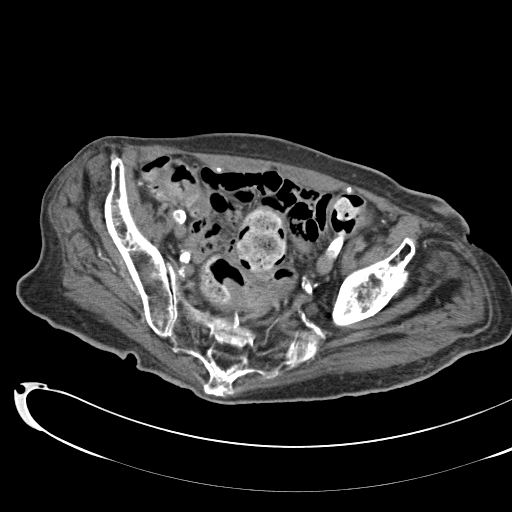
[im 32/86  soft-tissue]
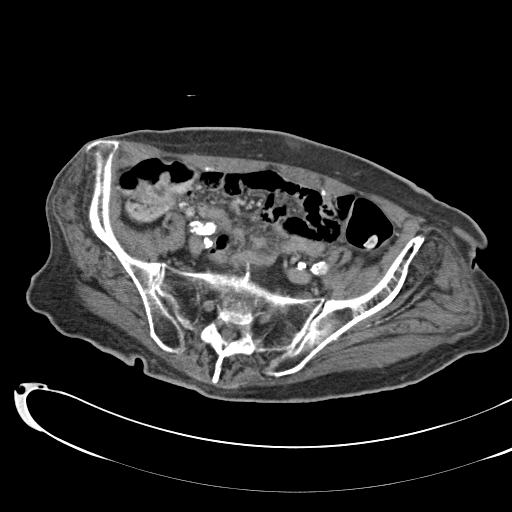
[im 41/86  soft-tissue]
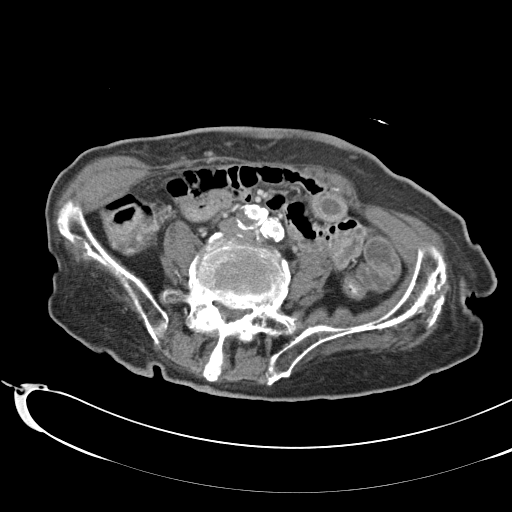
[im 45/86  soft-tissue]
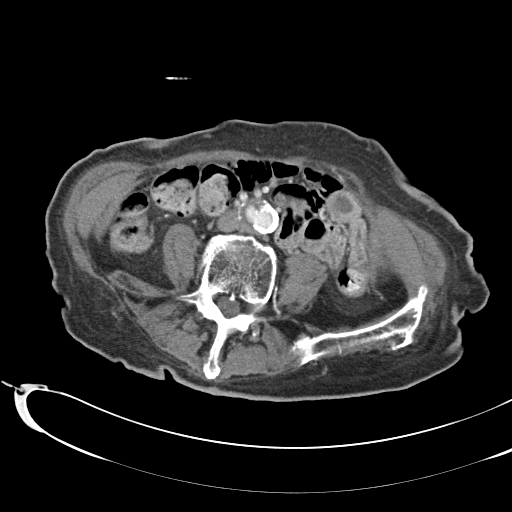
[im 54/86  soft-tissue]
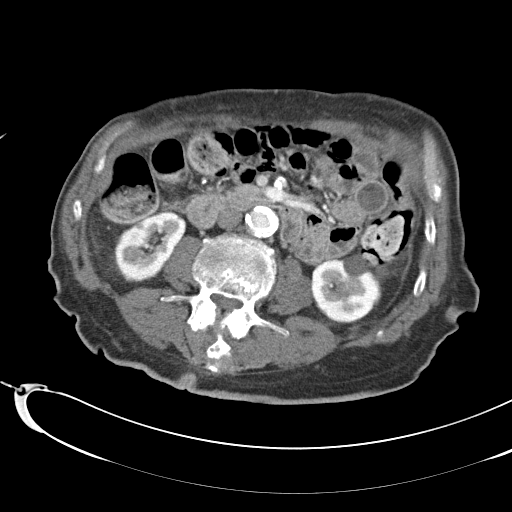
[im 59/86  soft-tissue]
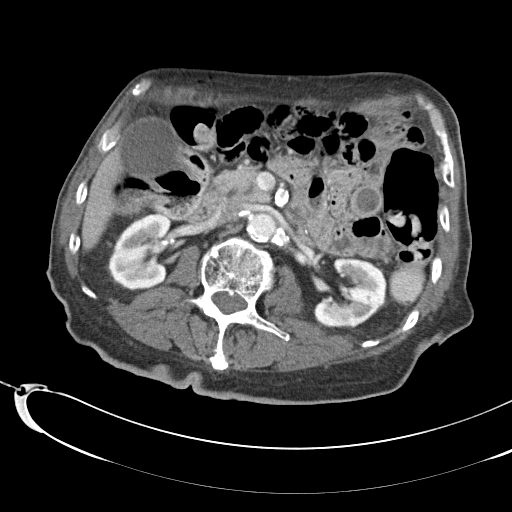
[im 59/86  bone]
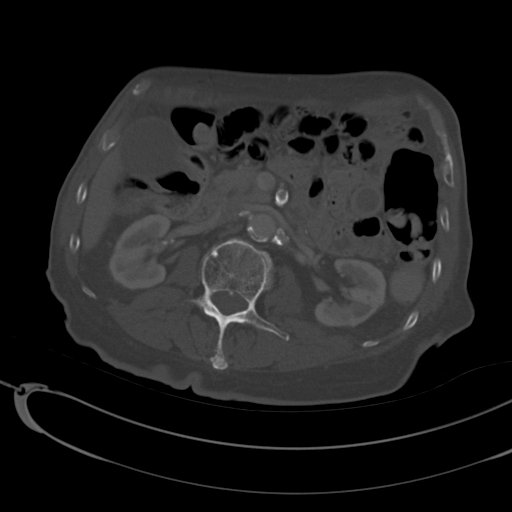
[im 68/86  soft-tissue]
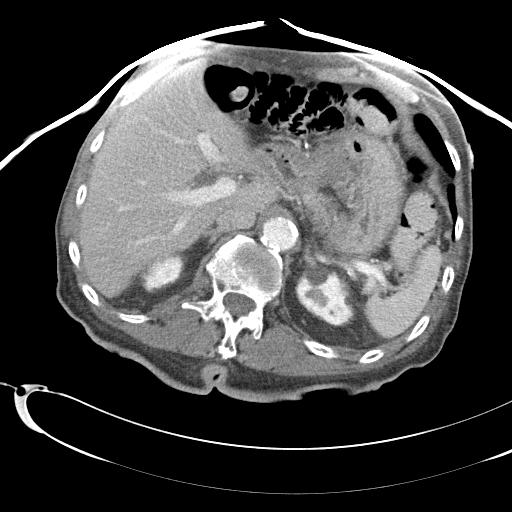
[im 72/86  soft-tissue]
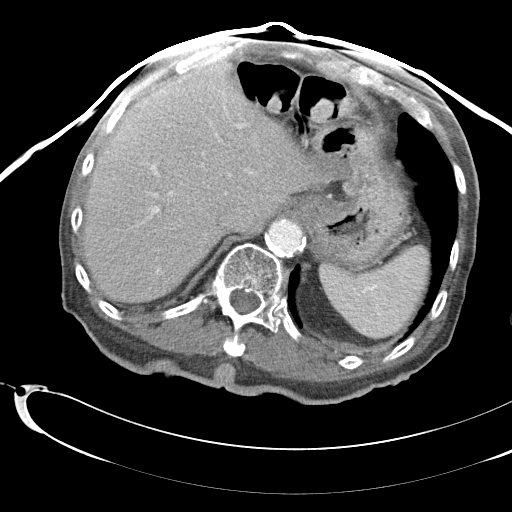
[im 81/86  soft-tissue]
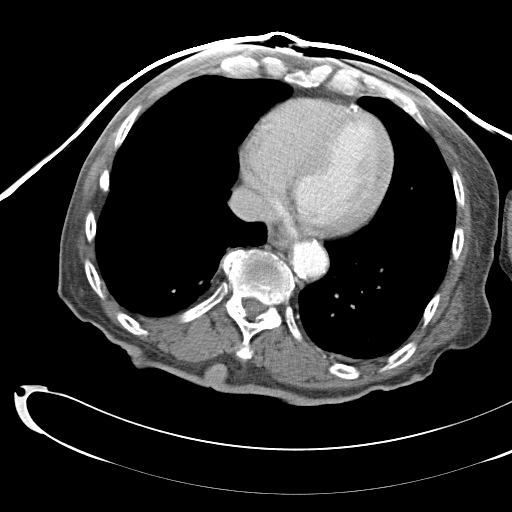

[Series 602: cor a/p · coronal · 0.84mm/px · 3 of 114 slices shown]
[im 38/114  soft-tissue]
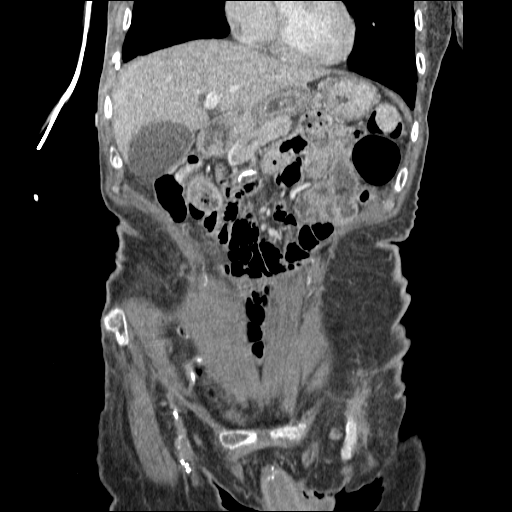
[im 51/114  soft-tissue]
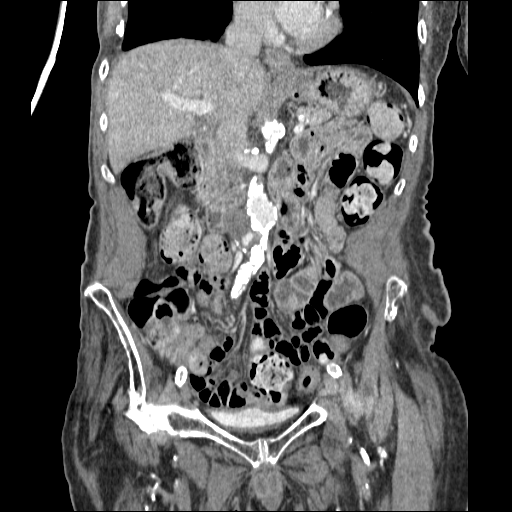
[im 63/114  soft-tissue]
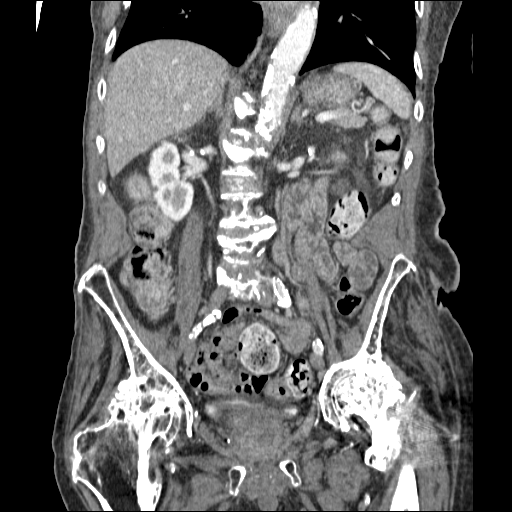

[15 of 46 positions shown; findings below may reference images not displayed]

FINDINGS: Coronary artery calcifications.  Marked calcifications of
the abdominal aorta.  Aneurysmal dilation of common origin of the
celiac artery and superior mesenteric artery.  Moderate narrowing
proximal celiac artery.  Diffuse significant stenosis of the
superior mesenteric artery.  Marked narrowing proximal right renal
artery.  Moderate to marked narrowing proximal left renal artery.

The patient has had iliac stents placed bilaterally.  Thrombosed
aneurysm of the iliac arteries bilaterally.  On the right, this
thrombosed aneurysm extends into the partially thrombosed aneurysm
along the right aspect of the abdominal aorta.  This spans over
cm in length.  Inferiorly contrast extends into this partially
thrombosed aortic aneurysm.  Peripheral calcifications noted
inferiorly.  Lack of calcifications superiorly.  False aneurysm at
this level cannot be excluded.  Maximal transverse dimension when
one includes what appears to be thrombosed abdominal aortic
aneurysm as well as calcified native aorta has a maximal transverse
dimension of 5.5 x 3.4 cm.  The portions which fills with contrast
at this level measures 2.3 x 2.1 cm (series 2 image 98). Presently
no evidence of active leak.

Beyond the iliac stents, marked to marked narrowing of the external
iliac artery more notable on the right.

Bilateral femoral popliteal bypass procedure performed with mild
ectasia at the proximal anastomotic site.  Right side is occluded
beyond the proximal aspect.

Moderate amount of stool in the rectosigmoid region.  Evaluation of
bowel is limited by lack of fat planes and areas of under
distension.  Few loops of small bowel appears slightly dilated but
without point of obstruction identified.  Stomach with wall
thickening.  Although this may be related to underdistension,
gastritis or other abnormality cannot be excluded.

Marked compression deformity L5 vertebra with moderate superior
endplate compression deformity L4, L2 and less so L1 with various
degrees of spinal stenosis and foraminal narrowing.  Disc disease
contributes to spinal stenosis which is multifactorial and marked
at the L2-3 and L4-5 level.  Significant hip joint degenerative
changes with prior surgery on the left.

Dilated gallbladder containing small number of stones.  No focal
hepatic, splenic, right adrenal or pancreatic lesion.  Mild
hyperplasia left adrenal gland.
IMPRESSION: Marked vascular disease status post iliac stenting and femoral
popliteal bypass as described above.  This includes an abdominal
aortic aneurysm with above described characteristics.

Dilated gallbladder containing gallstones.

Multiple lumbar compression deformities with multifactorial spinal
stenosis as described above.

Under distended stomach with thickened wall.  Gastritis or mass
cannot be excluded.  Also a few loops of small bowel are slightly
dilated and there is a prominent amount of stool in the
rectosigmoid region.

This has been made a call report.

## 2012-06-06 ENCOUNTER — Other Ambulatory Visit: Payer: Self-pay

## 2012-06-06 ENCOUNTER — Ambulatory Visit: Payer: Self-pay | Admitting: Neurosurgery

## 2012-06-12 ENCOUNTER — Encounter: Payer: Self-pay | Admitting: Neurosurgery

## 2012-06-13 ENCOUNTER — Ambulatory Visit: Payer: Self-pay | Admitting: Neurosurgery

## 2012-06-13 ENCOUNTER — Other Ambulatory Visit: Payer: Self-pay

## 2012-08-29 ENCOUNTER — Other Ambulatory Visit: Payer: Self-pay | Admitting: *Deleted

## 2012-08-29 DIAGNOSIS — I6529 Occlusion and stenosis of unspecified carotid artery: Secondary | ICD-10-CM

## 2012-08-29 DIAGNOSIS — I714 Abdominal aortic aneurysm, without rupture: Secondary | ICD-10-CM

## 2012-09-04 ENCOUNTER — Encounter: Payer: Self-pay | Admitting: Neurosurgery

## 2012-09-05 ENCOUNTER — Ambulatory Visit: Payer: Self-pay | Admitting: Neurosurgery

## 2012-09-05 ENCOUNTER — Other Ambulatory Visit: Payer: Self-pay

## 2012-12-10 ENCOUNTER — Encounter: Payer: Self-pay | Admitting: Gastroenterology

## 2013-11-19 ENCOUNTER — Ambulatory Visit: Payer: Self-pay | Admitting: Internal Medicine

## 2014-07-20 ENCOUNTER — Emergency Department (HOSPITAL_COMMUNITY): Payer: Medicare Other

## 2014-07-20 ENCOUNTER — Encounter (HOSPITAL_COMMUNITY): Payer: Self-pay | Admitting: Emergency Medicine

## 2014-07-20 ENCOUNTER — Inpatient Hospital Stay (HOSPITAL_COMMUNITY)
Admission: EM | Admit: 2014-07-20 | Discharge: 2014-07-25 | DRG: 640 | Disposition: A | Discharge status: Home / Self Care | Payer: Medicare Other | Attending: Internal Medicine | Admitting: Internal Medicine

## 2014-07-20 DIAGNOSIS — E43 Unspecified severe protein-calorie malnutrition: Secondary | ICD-10-CM | POA: Insufficient documentation

## 2014-07-20 DIAGNOSIS — I2510 Atherosclerotic heart disease of native coronary artery without angina pectoris: Secondary | ICD-10-CM | POA: Diagnosis present

## 2014-07-20 DIAGNOSIS — E860 Dehydration: Secondary | ICD-10-CM | POA: Diagnosis not present

## 2014-07-20 DIAGNOSIS — Z87891 Personal history of nicotine dependence: Secondary | ICD-10-CM

## 2014-07-20 DIAGNOSIS — Z681 Body mass index (BMI) 19 or less, adult: Secondary | ICD-10-CM

## 2014-07-20 DIAGNOSIS — R29898 Other symptoms and signs involving the musculoskeletal system: Secondary | ICD-10-CM | POA: Diagnosis present

## 2014-07-20 DIAGNOSIS — I6529 Occlusion and stenosis of unspecified carotid artery: Secondary | ICD-10-CM | POA: Diagnosis present

## 2014-07-20 DIAGNOSIS — I252 Old myocardial infarction: Secondary | ICD-10-CM

## 2014-07-20 DIAGNOSIS — D61818 Other pancytopenia: Secondary | ICD-10-CM | POA: Diagnosis present

## 2014-07-20 DIAGNOSIS — J90 Pleural effusion, not elsewhere classified: Secondary | ICD-10-CM

## 2014-07-20 DIAGNOSIS — I498 Other specified cardiac arrhythmias: Secondary | ICD-10-CM

## 2014-07-20 DIAGNOSIS — Z7902 Long term (current) use of antithrombotics/antiplatelets: Secondary | ICD-10-CM

## 2014-07-20 DIAGNOSIS — D649 Anemia, unspecified: Secondary | ICD-10-CM

## 2014-07-20 DIAGNOSIS — E119 Type 2 diabetes mellitus without complications: Secondary | ICD-10-CM

## 2014-07-20 DIAGNOSIS — I69998 Other sequelae following unspecified cerebrovascular disease: Secondary | ICD-10-CM

## 2014-07-20 DIAGNOSIS — R55 Syncope and collapse: Secondary | ICD-10-CM

## 2014-07-20 DIAGNOSIS — E785 Hyperlipidemia, unspecified: Secondary | ICD-10-CM | POA: Diagnosis present

## 2014-07-20 DIAGNOSIS — R001 Bradycardia, unspecified: Secondary | ICD-10-CM

## 2014-07-20 DIAGNOSIS — R197 Diarrhea, unspecified: Secondary | ICD-10-CM

## 2014-07-20 DIAGNOSIS — E538 Deficiency of other specified B group vitamins: Secondary | ICD-10-CM | POA: Diagnosis present

## 2014-07-20 DIAGNOSIS — D519 Vitamin B12 deficiency anemia, unspecified: Secondary | ICD-10-CM | POA: Diagnosis present

## 2014-07-20 DIAGNOSIS — I6350 Cerebral infarction due to unspecified occlusion or stenosis of unspecified cerebral artery: Secondary | ICD-10-CM

## 2014-07-20 DIAGNOSIS — S78119A Complete traumatic amputation at level between unspecified hip and knee, initial encounter: Secondary | ICD-10-CM

## 2014-07-20 DIAGNOSIS — Z7401 Bed confinement status: Secondary | ICD-10-CM

## 2014-07-20 DIAGNOSIS — D696 Thrombocytopenia, unspecified: Secondary | ICD-10-CM | POA: Diagnosis present

## 2014-07-20 DIAGNOSIS — Z794 Long term (current) use of insulin: Secondary | ICD-10-CM

## 2014-07-20 DIAGNOSIS — D509 Iron deficiency anemia, unspecified: Secondary | ICD-10-CM | POA: Diagnosis present

## 2014-07-20 DIAGNOSIS — I739 Peripheral vascular disease, unspecified: Secondary | ICD-10-CM | POA: Diagnosis present

## 2014-07-20 DIAGNOSIS — D62 Acute posthemorrhagic anemia: Secondary | ICD-10-CM | POA: Diagnosis present

## 2014-07-20 DIAGNOSIS — Z7982 Long term (current) use of aspirin: Secondary | ICD-10-CM

## 2014-07-20 DIAGNOSIS — M2550 Pain in unspecified joint: Secondary | ICD-10-CM | POA: Diagnosis present

## 2014-07-20 DIAGNOSIS — I10 Essential (primary) hypertension: Secondary | ICD-10-CM | POA: Diagnosis present

## 2014-07-20 LAB — URINALYSIS, ROUTINE W REFLEX MICROSCOPIC
Bilirubin Urine: NEGATIVE
Glucose, UA: NEGATIVE mg/dL
Ketones, ur: NEGATIVE mg/dL
LEUKOCYTES UA: NEGATIVE
Nitrite: NEGATIVE
Protein, ur: NEGATIVE mg/dL
Specific Gravity, Urine: 1.02 (ref 1.005–1.030)
UROBILINOGEN UA: 0.2 mg/dL (ref 0.0–1.0)
pH: 5 (ref 5.0–8.0)

## 2014-07-20 LAB — BASIC METABOLIC PANEL
ANION GAP: 12 (ref 5–15)
BUN: 17 mg/dL (ref 6–23)
CALCIUM: 8.4 mg/dL (ref 8.4–10.5)
CHLORIDE: 103 meq/L (ref 96–112)
CO2: 23 meq/L (ref 19–32)
Creatinine, Ser: 0.87 mg/dL (ref 0.50–1.35)
GFR calc Af Amer: >90 mL/min (ref >90)
GFR calc non Af Amer: 81 mL/min — ABNORMAL LOW (ref >90)
GLUCOSE: 159 mg/dL — AB (ref 70–99)
POTASSIUM: 4.4 meq/L (ref 3.7–5.3)
SODIUM: 138 meq/L (ref 137–147)

## 2014-07-20 LAB — CBC WITH DIFFERENTIAL/PLATELET
BASOS ABS: 0 10*3/uL (ref 0.0–0.1)
Basophils Relative: 0 % (ref 0–1)
EOS PCT: 1 % (ref 0–5)
Eosinophils Absolute: 0 10*3/uL (ref 0.0–0.7)
HCT: 27.4 % — ABNORMAL LOW (ref 39.0–52.0)
Hemoglobin: 9 g/dL — ABNORMAL LOW (ref 13.0–17.0)
LYMPHS ABS: 0.7 10*3/uL (ref 0.7–4.0)
LYMPHS PCT: 15 % (ref 12–46)
MCH: 30.8 pg (ref 26.0–34.0)
MCHC: 32.8 g/dL (ref 30.0–36.0)
MCV: 93.8 fL (ref 78.0–100.0)
Monocytes Absolute: 0.3 10*3/uL (ref 0.1–1.0)
Monocytes Relative: 7 % (ref 3–12)
NEUTROS ABS: 3.9 10*3/uL (ref 1.7–7.7)
NEUTROS PCT: 77 % (ref 43–77)
PLATELETS: 128 10*3/uL — AB (ref 150–400)
RBC: 2.92 MIL/uL — AB (ref 4.22–5.81)
RDW: 16.4 % — AB (ref 11.5–15.5)
WBC: 5 10*3/uL (ref 4.0–10.5)

## 2014-07-20 LAB — RAPID URINE DRUG SCREEN, HOSP PERFORMED
AMPHETAMINES: NOT DETECTED
Barbiturates: NOT DETECTED
Benzodiazepines: NOT DETECTED
Cocaine: NOT DETECTED
Opiates: NOT DETECTED
TETRAHYDROCANNABINOL: NOT DETECTED

## 2014-07-20 LAB — I-STAT VENOUS BLOOD GAS, ED
Bicarbonate: 25 mEq/L — ABNORMAL HIGH (ref 20.0–24.0)
O2 SAT: 42 %
PH VEN: 7.383 — AB (ref 7.250–7.300)
Patient temperature: 37
TCO2: 26 mmol/L (ref 0–100)
pCO2, Ven: 41.9 mmHg — ABNORMAL LOW (ref 45.0–50.0)
pO2, Ven: 24 mmHg — CL (ref 30.0–45.0)

## 2014-07-20 LAB — I-STAT TROPONIN, ED: Troponin i, poc: 0 ng/mL (ref 0.00–0.08)

## 2014-07-20 LAB — GLUCOSE, CAPILLARY: Glucose-Capillary: 115 mg/dL — ABNORMAL HIGH (ref 70–99)

## 2014-07-20 LAB — ETHANOL

## 2014-07-20 LAB — TROPONIN I: Troponin I: <0.3 ng/mL

## 2014-07-20 LAB — URINE MICROSCOPIC-ADD ON

## 2014-07-20 LAB — APTT: aPTT: 36 seconds (ref 24–37)

## 2014-07-20 LAB — I-STAT CG4 LACTIC ACID, ED: Lactic Acid, Venous: 1.79 mmol/L (ref 0.5–2.2)

## 2014-07-20 LAB — PROTIME-INR
INR: 1.25 (ref 0.00–1.49)
Prothrombin Time: 15.7 seconds — ABNORMAL HIGH (ref 11.6–15.2)

## 2014-07-20 LAB — POC OCCULT BLOOD, ED: FECAL OCCULT BLD: NEGATIVE

## 2014-07-20 MED ORDER — ATORVASTATIN CALCIUM 80 MG PO TABS
80.0000 mg | ORAL_TABLET | Freq: Every day | ORAL | Status: DC
  Administered 2014-07-20 – 2014-07-25 (×6): 80 mg via ORAL
  Filled 2014-07-20 (×6): qty 1

## 2014-07-20 MED ORDER — ACETAMINOPHEN 650 MG RE SUPP: 650.0000 mg | Freq: Four times a day (QID) | RECTAL | Status: DC | PRN

## 2014-07-20 MED ORDER — PIOGLITAZONE HCL 45 MG PO TABS
45.0000 mg | ORAL_TABLET | Freq: Every day | ORAL | Status: DC
  Administered 2014-07-21 – 2014-07-25 (×5): 45 mg via ORAL
  Filled 2014-07-20 (×5): qty 1

## 2014-07-20 MED ORDER — SODIUM CHLORIDE 0.9 % IV BOLUS (SEPSIS)
1000.0000 mL | Freq: Once | INTRAVENOUS | Status: AC
  Administered 2014-07-20: 1000 mL via INTRAVENOUS

## 2014-07-20 MED ORDER — HYDROCODONE-ACETAMINOPHEN 5-325 MG PO TABS
1.0000 | ORAL_TABLET | ORAL | Status: DC | PRN
  Administered 2014-07-22: 1 via ORAL
  Filled 2014-07-20: qty 1

## 2014-07-20 MED ORDER — ENOXAPARIN SODIUM 40 MG/0.4ML ~~LOC~~ SOLN
40.0000 mg | SUBCUTANEOUS | Status: DC
  Administered 2014-07-20 – 2014-07-24 (×5): 40 mg via SUBCUTANEOUS
  Filled 2014-07-20 (×6): qty 0.4

## 2014-07-20 MED ORDER — INSULIN ASPART 100 UNIT/ML ~~LOC~~ SOLN: 0.0000 [IU] | Freq: Every day | SUBCUTANEOUS | Status: DC

## 2014-07-20 MED ORDER — SODIUM CHLORIDE 0.9 % IJ SOLN
3.0000 mL | Freq: Two times a day (BID) | INTRAMUSCULAR | Status: DC
  Administered 2014-07-20 – 2014-07-25 (×7): 3 mL via INTRAVENOUS

## 2014-07-20 MED ORDER — ZOLPIDEM TARTRATE 5 MG PO TABS: 5.0000 mg | ORAL_TABLET | Freq: Every evening | ORAL | Status: DC | PRN

## 2014-07-20 MED ORDER — ACETAMINOPHEN 325 MG PO TABS
650.0000 mg | ORAL_TABLET | Freq: Four times a day (QID) | ORAL | Status: DC | PRN
  Administered 2014-07-21: 650 mg via ORAL
  Filled 2014-07-20: qty 2

## 2014-07-20 MED ORDER — CLOPIDOGREL BISULFATE 75 MG PO TABS
75.0000 mg | ORAL_TABLET | Freq: Every day | ORAL | Status: DC
  Administered 2014-07-21 – 2014-07-25 (×5): 75 mg via ORAL
  Filled 2014-07-20 (×5): qty 1

## 2014-07-20 MED ORDER — ONDANSETRON HCL 4 MG PO TABS: 4.0000 mg | ORAL_TABLET | Freq: Four times a day (QID) | ORAL | Status: DC | PRN

## 2014-07-20 MED ORDER — ONDANSETRON HCL 4 MG/2ML IJ SOLN: 4.0000 mg | Freq: Four times a day (QID) | INTRAMUSCULAR | Status: DC | PRN

## 2014-07-20 MED ORDER — SODIUM CHLORIDE 0.9 % IV SOLN
INTRAVENOUS | Status: DC
  Administered 2014-07-20 – 2014-07-23 (×5): via INTRAVENOUS

## 2014-07-20 MED ORDER — INSULIN ASPART 100 UNIT/ML ~~LOC~~ SOLN: 0.0000 [IU] | Freq: Three times a day (TID) | SUBCUTANEOUS | Status: DC

## 2014-07-20 NOTE — H&P (Addendum)
[facility]                                                                                    Patient Demographics  [name], is a 79 y.o. male  CSN: [id number]  MRN: [id number]  DOB - September 13, 1935  Admit Date - 07/20/2014  Outpatient Primary MD for the patient is Dorrene German, MD   With History of -  Past Medical History  Diagnosis Date  . Diabetes mellitus   . Hypertension   . Myocardial infarction 2005  . Foot pain     while lying  . Blood clot in vein   . Blindness temporary   . Joint pain   . Loss of appetite   . Hyperlipidemia   . CAD (coronary artery disease)   . Stroke     resulting in left side weakness      Past Surgical History  Procedure Laterality Date  . Pr vein bypass graft,aorto-fem-pop    . Above knee leg amputation      right    in for   Chief Complaint  Patient presents with  . Loss of Consciousness     HPI  [name]  is a 79 y.o. male, with past medical history significant for diabetes mellitus hypertension and coronary artery disease in addition to multiple strokes in the past presenting today with a syncopal episode noted by his daughter. The patient slumped to the right and the family could not wake him up. It lasted for 5-10 minutes, no seizure activity noted, no complaints of chest pains or palpitations . His blood sugar was normal. the patient is not a good historian. The patient is bedbound and stays at home with his daughter who takes care of all activities of daily living. The daughter also reports 2 days history of diarrheic stools 2-3 times a day, nonbloody, nonmucoid.    Review of Systems    In addition to the HPI above,  No Fever-chills, No Headache, No changes with Vision or hearing, No problems swallowing food or Liquids, No Chest pain, Cough or Shortness of Breath, No Abdominal pain, No Nausea or Vommitting, Bowel movements are regular, No Blood in stool or Urine, No dysuria, No new skin  rashes or bruises, No new joints pains-aches,  No recent weight gain or loss, No polyuria, polydypsia or polyphagia, No significant Mental Stressors.  A full 10 point Review of Systems was done, except as stated above, all other Review of Systems were negative.   Social History History  Substance Use Topics  . Smoking status: Former Smoker    Types: Cigarettes    Quit date: 11/14/2002  . Smokeless tobacco: Not on file  . Alcohol Use: No     Family History -Reviewed; patient denies any  Prior to Admission medications   Medication Sig Start Date End Date Taking? Authorizing Provider  atorvastatin (LIPITOR) 80 MG tablet Take 80 mg by mouth daily.     Yes Historical Provider, MD  clopidogrel (PLAVIX) 75 MG tablet Take 75 mg by mouth daily.     Yes Historical Provider, MD  metoprolol succinate (TOPROL-XL) 25 MG 24 hr tablet Take  25 mg by mouth daily.     Yes Historical Provider, MD  pioglitazone (ACTOS) 45 MG tablet Take 45 mg by mouth daily.     Yes Historical Provider, MD  traMADol (ULTRAM) 50 MG tablet Take 50 mg by mouth every 6 (six) hours as needed. 06/19/14  Yes Historical Provider, MD    No Known Allergies  Physical Exam  Vitals  Blood pressure 124/39, pulse 50, temperature 96.9 F (36.1 C), temperature source Rectal, resp. rate 14, SpO2 98.00%.   1. General elderly African American male bedbound, in no acute distress  2. pleasantly confused.  3. No F.N deficits, grossly, ALL C.Nerves Intact,  4. Ears and Eyes appear Normal, Conjunctivae clear, edentulous, dry buccal mucosa.  5. Supple Neck, No JVD, No cervical lymphadenopathy appriciated, No Carotid Bruits.  6. Symmetrical Chest wall movement, Good air movement bilaterally, .  7. RRR, No Gallops, Rubs or Murmurs, No Parasternal Heave.  8. Positive Bowel Sounds, Abdomen Soft, scaphoid Non tender, No organomegaly appriciated,No rebound -guarding or rigidity.  9.  No Cyanosis, Normal Skin Turgor, No Skin Rash  or Bruise.  10. Good muscle tone, status post bilateral AKA.  11. No Palpable Lymph Nodes in Neck or Axillae    Data Review  CBC  Recent Labs Lab 07/20/14 1545  WBC 5.0  HGB 9.0*  HCT 27.4*  PLT 128*  MCV 93.8  MCH 30.8  MCHC 32.8  RDW 16.4*  LYMPHSABS 0.7  MONOABS 0.3  EOSABS 0.0  BASOSABS 0.0   ------------------------------------------------------------------------------------------------------------------  Chemistries   Recent Labs Lab 07/20/14 1545  NA 138  K 4.4  CL 103  CO2 23  GLUCOSE 159*  BUN 17  CREATININE 0.87  CALCIUM 8.4   ------------------------------------------------------------------------------------------------------------------ CrCl is unknown because both a height and weight (above a minimum accepted value) are required for this calculation. ------------------------------------------------------------------------------------------------------------------ No results found for this basename: TSH, T4TOTAL, FREET3, T3FREE, THYROIDAB,  in the last 72 hours   Coagulation profile  Recent Labs Lab 07/20/14 1545  INR 1.25   ------------------------------------------------------------------------------------------------------------------- No results found for this basename: DDIMER,  in the last 72 hours -------------------------------------------------------------------------------------------------------------------  Cardiac Enzymes No results found for this basename: CK, CKMB, TROPONINI, MYOGLOBIN,  in the last 168 hours ------------------------------------------------------------------------------------------------------------------ No components found with this basename: POCBNP,    ---------------------------------------------------------------------------------------------------------------  Urinalysis    Component Value Date/Time   COLORURINE YELLOW 07/20/2014 1818   APPEARANCEUR CLOUDY* 07/20/2014 1818   LABSPEC 1.020 07/20/2014  1818   PHURINE 5.0 07/20/2014 1818   GLUCOSEU NEGATIVE 07/20/2014 1818   HGBUR SMALL* 07/20/2014 1818   BILIRUBINUR NEGATIVE 07/20/2014 1818   KETONESUR NEGATIVE 07/20/2014 1818   PROTEINUR NEGATIVE 07/20/2014 1818   UROBILINOGEN 0.2 07/20/2014 1818   NITRITE NEGATIVE 07/20/2014 1818   LEUKOCYTESUR NEGATIVE 07/20/2014 1818    ----------------------------------------------------------------------------------------------------------------     Imaging results:   Dg Chest 2 View  07/20/2014   CLINICAL DATA:  Syncope  EXAM: CHEST  2 VIEW  COMPARISON:  Multiple exams, including 05/13/2010  FINDINGS: The peripheral lucency over the left hemithorax is probably due to a large wrinkling of the skin although its position makes it difficult to see lung markings extending peripheral to it. While I doubt that this is a pneumothorax, further imaging may be required if the patient is breath sounds are distant on the left.  Moderate right pleural effusion. Suspected emphysema. Chronic right seventh rib deformity. Atherosclerotic vascular calcifications. Heart size within normal limits.  IMPRESSION: 1. Moderate right pleural effusion. 2. I  suspect that the lucency over the left lateral chest is due to a skin fold rather than pneumothorax ; if left-sided chest sounds are distant than further imaging workup may be warranted. 3. Atherosclerotic aortic arch. 4. Suspected emphysema. 5. Chronic right seventh rib deformity posterolaterally.   Electronically Signed   By: Herbie Baltimore M.D.   On: 07/20/2014 18:02   Ct Head Wo Contrast  07/20/2014   CLINICAL DATA:  Loss of consciousness.  EXAM: CT HEAD WITHOUT CONTRAST  TECHNIQUE: Contiguous axial images were obtained from the base of the skull through the vertex without intravenous contrast.  COMPARISON:  10/27/2008  FINDINGS: Ventricles and cisterns are within normal. There is mild age related atrophic change. There is a moderate size old right MCA territory infarct as well as old  infarcts over the left cerebellum and left occipital region. There is mild chronic ischemic microvascular disease. There is no mass, mass effect, shift of midline structures or acute hemorrhage. There is no evidence to suggest acute infarction. Remainder the exam is unchanged.  IMPRESSION: No acute intracranial findings.  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   Electronically Signed   By: Elberta Fortis M.D.   On: 07/20/2014 16:51    My personal review of EKG: Right bundle branch block at a rate of 51 beats per minute no acute changes    Assessment & Plan   1. syncopal episode ; now back to baseline     Admit for observation     Serial troponins     Check echocardiogram     Check carotid Dopplers      2. Bradycardia     Hold his beta blocker today     Monitor  3. Right pleural effusion     Asymptomatic     May consult IR in AM  4. Diarrhea with no significant signs and symptoms of dehydration     IV fluids     Monitor     Check C. Difficile  5. Diabetes mellitus     Continue Actos     Insulin sliding scale  6. History of hypertension     Controlled with episodes of hypotension  7. History of CVA, coronary artery disease and hyperlipidemia  8. Mild hypothermia    Received a Economist in ER. Now off, Monitor    DVT Prophylaxis Lovenox  AM Labs Ordered, also please review Full Orders  Family Communication: Admission, patients condition and plan of care including tests being ordered have been discussed with the patient and daughter who indicate understanding and agree with the plan and Code Status.  Code Status full  Disposition Plan: Home under the care of his daughter  Time spent in minutes : 35 minutes  Condition GUARDED   @

## 2014-07-20 NOTE — ED Notes (Signed)
Pt monitored by pulse ox, bp cuff, and 12-lead. 

## 2014-07-20 NOTE — ED Notes (Signed)
Transporting patient to new room assignment. 

## 2014-07-20 NOTE — ED Provider Notes (Signed)
CSN: [id number]     Arrival date & time 07/20/14  1527 History   First MD Initiated Contact with Patient 07/20/14 1539     Chief Complaint  Patient presents with  . Loss of Consciousness     (Consider location/radiation/quality/duration/timing/severity/associated sxs/prior Treatment) HPI Comments: The patient is a 79 year old male past history of CAD, CVA, hypertension, bilateral AKA, presents via EMS emergency room chief complaint of passing out prior to arrival. Patient's daughter reports the patient slumped over in his chair while eating for approximately 10 minutes, he was not responsive to touch or sounds. Patient's daughter reports patient had multiple episodes of diarrhea yesterday. Patient's family reports patient is at baseline at this time. The patient denies chest pain, shortness breath, abdominal pain, fever, weakness, chills, numbness, headache, visual changes. The patient is a full code. PCP: Dorrene German, MD  The history is provided by the patient. No language interpreter was used.    Past Medical History  Diagnosis Date  . Diabetes mellitus   . Hypertension   . Myocardial infarction 2005  . Foot pain     while lying  . Blood clot in vein   . Blindness temporary   . Joint pain   . Loss of appetite   . Hyperlipidemia   . CAD (coronary artery disease)   . Stroke     resulting in left side weakness   Past Surgical History  Procedure Laterality Date  . Pr vein bypass graft,aorto-fem-pop    . Above knee leg amputation      right   History reviewed. No pertinent family history. History  Substance Use Topics  . Smoking status: Former Smoker    Types: Cigarettes    Quit date: 11/14/2002  . Smokeless tobacco: Not on file  . Alcohol Use: No    Review of Systems  Constitutional: Negative for fever and chills.  Eyes: Negative for visual disturbance.  Respiratory: Negative for cough and shortness of breath.   Cardiovascular: Negative for chest pain.   Gastrointestinal: Positive for diarrhea. Negative for nausea, vomiting, abdominal pain and constipation.  Neurological: Positive for syncope. Negative for headaches.      Allergies  Review of patient's allergies indicates no known allergies.  Home Medications   Prior to Admission medications   Medication Sig Start Date End Date Taking? Authorizing Provider  acetaminophen (TYLENOL) 325 MG tablet Take 650 mg by mouth every 6 (six) hours as needed.      Historical Provider, MD  aspirin 325 MG EC tablet Take 325 mg by mouth daily.      Historical Provider, MD  atorvastatin (LIPITOR) 80 MG tablet Take 80 mg by mouth daily.      Historical Provider, MD  Cholecalciferol (VITAMIN D3) 50000 UNITS CAPS Take 5,000 Units by mouth every 30 (thirty) days.      Historical Provider, MD  clopidogrel (PLAVIX) 75 MG tablet Take 75 mg by mouth daily.      Historical Provider, MD  docusate sodium (COLACE) 100 MG capsule Take 100 mg by mouth 2 (two) times daily as needed.      Historical Provider, MD  ferrous sulfate 325 (65 FE) MG tablet Take 325 mg by mouth 3 (three) times daily with meals.      Historical Provider, MD  hydrocodone-acetaminophen (LORCET-HD) 5-500 MG per capsule Take 1 capsule by mouth every 6 (six) hours as needed.      Historical Provider, MD  insulin aspart (NOVOLOG FLEXPEN) 100 UNIT/ML injection Inject 5 Units  into the skin 3 (three) times daily before meals.      Historical Provider, MD  Magnesium Hydroxide (MILK OF MAGNESIA PO) Take 30 mLs by mouth daily as needed.      Historical Provider, MD  metoprolol succinate (TOPROL-XL) 25 MG 24 hr tablet Take 25 mg by mouth daily.      Historical Provider, MD  mirtazapine (REMERON) 7.5 MG tablet Take 7.5 mg by mouth at bedtime.      Historical Provider, MD  Multiple Vitamin (MULTIVITAMIN) capsule Take 1 capsule by mouth daily.      Historical Provider, MD  nitroGLYCERIN (NITROSTAT) 0.4 MG SL tablet Place 0.4 mg under the tongue every 5 (five)  minutes as needed.      Historical Provider, MD  pioglitazone (ACTOS) 45 MG tablet Take 45 mg by mouth daily.      Historical Provider, MD  ranitidine (ZANTAC) 150 MG tablet Take 150 mg by mouth at bedtime.      Historical Provider, MD  vitamin C (ASCORBIC ACID) 500 MG tablet Take 500 mg by mouth 2 (two) times daily.      Historical Provider, MD  zolpidem (AMBIEN) 5 MG tablet Take 5 mg by mouth at bedtime as needed.      Historical Provider, MD   There were no vitals taken for this visit. Physical Exam  Nursing note and vitals reviewed. Constitutional: He is oriented to person, place, and time. He appears well-developed. He appears cachectic. No distress.  HENT:  Head: Normocephalic and atraumatic.  Mouth/Throat: Uvula is midline. Mucous membranes are dry. He does not have dentures.  Edentulous  Eyes: EOM are normal. Pupils are equal, round, and reactive to light.  Neck: Neck supple.  Cardiovascular: Regular rhythm.  Bradycardia present.   Decrease heart sounds.  Pulmonary/Chest: Effort normal. Not tachypneic. No respiratory distress. He has no decreased breath sounds. He has no wheezes. He has no rales.  Abdominal: Soft. There is no tenderness. There is no rebound.  Musculoskeletal:  Bilateral AKA  Neurological: He is alert and oriented to person, place, and time. No cranial nerve deficit. He exhibits abnormal muscle tone. GCS eye subscore is 4. GCS verbal subscore is 5. GCS motor subscore is 6.  Speech is muffled secondary to edentulous, and goal oriented, follows commands Cranial nerves III - XII grossly intact, no facial droop Left upper extremity 4/5, Right upper extremity 5/5  bilaterally, strong and equal grip strength Sensation normal to light and sharp touch Moves all 4 extremities without ataxia, coordination intact Normal finger to nose and rapid alternating movements No pronator drift  Skin: Skin is warm and dry. He is not diaphoretic.  Psychiatric: He has a normal mood  and affect.    ED Course  Procedures (including critical care time) Labs Review Results for orders placed during the hospital encounter of 07/20/14  CBC WITH DIFFERENTIAL      Result Value Ref Range   WBC 5.0  4.0 - 10.5 K/uL   RBC 2.92 (*) 4.22 - 5.81 MIL/uL   Hemoglobin 9.0 (*) 13.0 - 17.0 g/dL   HCT 45.4 (*) 09.8 - 11.9 %   MCV 93.8  78.0 - 100.0 fL   MCH 30.8  26.0 - 34.0 pg   MCHC 32.8  30.0 - 36.0 g/dL   RDW 14.7 (*) 82.9 - 56.2 %   Platelets 128 (*) 150 - 400 K/uL   Neutrophils Relative % 77  43 - 77 %   Neutro Abs 3.9  1.7 - 7.7 K/uL   Lymphocytes Relative 15  12 - 46 %   Lymphs Abs 0.7  0.7 - 4.0 K/uL   Monocytes Relative 7  3 - 12 %   Monocytes Absolute 0.3  0.1 - 1.0 K/uL   Eosinophils Relative 1  0 - 5 %   Eosinophils Absolute 0.0  0.0 - 0.7 K/uL   Basophils Relative 0  0 - 1 %   Basophils Absolute 0.0  0.0 - 0.1 K/uL  BASIC METABOLIC PANEL      Result Value Ref Range   Sodium 138  137 - 147 mEq/L   Potassium 4.4  3.7 - 5.3 mEq/L   Chloride 103  96 - 112 mEq/L   CO2 23  19 - 32 mEq/L   Glucose, Bld 159 (*) 70 - 99 mg/dL   BUN 17  6 - 23 mg/dL   Creatinine, Ser 1.61  0.50 - 1.35 mg/dL   Calcium 8.4  8.4 - 09.6 mg/dL   GFR calc non Af Amer 81 (*) >90 mL/min   GFR calc Af Amer >90  >90 mL/min   Anion gap 12  5 - 15  URINALYSIS, ROUTINE W REFLEX MICROSCOPIC      Result Value Ref Range   Color, Urine YELLOW  YELLOW   APPearance CLOUDY (*) CLEAR   Specific Gravity, Urine 1.020  1.005 - 1.030   pH 5.0  5.0 - 8.0   Glucose, UA NEGATIVE  NEGATIVE mg/dL   Hgb urine dipstick SMALL (*) NEGATIVE   Bilirubin Urine NEGATIVE  NEGATIVE   Ketones, ur NEGATIVE  NEGATIVE mg/dL   Protein, ur NEGATIVE  NEGATIVE mg/dL   Urobilinogen, UA 0.2  0.0 - 1.0 mg/dL   Nitrite NEGATIVE  NEGATIVE   Leukocytes, UA NEGATIVE  NEGATIVE  ETHANOL      Result Value Ref Range   Alcohol, Ethyl (B) <11  0 - 11 mg/dL  PROTIME-INR      Result Value Ref Range   Prothrombin Time 15.7 (*) 11.6  - 15.2 seconds   INR 1.25  0.00 - 1.49  APTT      Result Value Ref Range   aPTT 36  24 - 37 seconds  URINE RAPID DRUG SCREEN (HOSP PERFORMED)      Result Value Ref Range   Opiates NONE DETECTED  NONE DETECTED   Cocaine NONE DETECTED  NONE DETECTED   Benzodiazepines NONE DETECTED  NONE DETECTED   Amphetamines NONE DETECTED  NONE DETECTED   Tetrahydrocannabinol NONE DETECTED  NONE DETECTED   Barbiturates NONE DETECTED  NONE DETECTED  URINE MICROSCOPIC-ADD ON      Result Value Ref Range   RBC / HPF 0-2  <3 RBC/hpf  I-STAT TROPOININ, ED      Result Value Ref Range   Troponin i, poc 0.00  0.00 - 0.08 ng/mL   Comment 3           I-STAT CG4 LACTIC ACID, ED      Result Value Ref Range   Lactic Acid, Venous 1.79  0.5 - 2.2 mmol/L  POC OCCULT BLOOD, ED      Result Value Ref Range   Fecal Occult Bld NEGATIVE  NEGATIVE  I-STAT VENOUS BLOOD GAS, ED      Result Value Ref Range   pH, Ven 7.383 (*) 7.250 - 7.300   pCO2, Ven 41.9 (*) 45.0 - 50.0 mmHg   pO2, Ven 24.0 (*) 30.0 - 45.0 mmHg   Bicarbonate 25.0 (*)  20.0 - 24.0 mEq/L   TCO2 26  0 - 100 mmol/L   O2 Saturation 42.0     Patient temperature 37.0 C     Collection site ARTERIAL LINE     Drawn by Operator     Sample type VENOUS     Comment NOTIFIED PHYSICIAN     Dg Chest 2 View  07/20/2014   CLINICAL DATA:  Syncope  EXAM: CHEST  2 VIEW  COMPARISON:  Multiple exams, including 05/13/2010  FINDINGS: The peripheral lucency over the left hemithorax is probably due to a large wrinkling of the skin although its position makes it difficult to see lung markings extending peripheral to it. While I doubt that this is a pneumothorax, further imaging may be required if the patient is breath sounds are distant on the left.  Moderate right pleural effusion. Suspected emphysema. Chronic right seventh rib deformity. Atherosclerotic vascular calcifications. Heart size within normal limits.  IMPRESSION: 1. Moderate right pleural effusion. 2. I suspect that  the lucency over the left lateral chest is due to a skin fold rather than pneumothorax ; if left-sided chest sounds are distant than further imaging workup may be warranted. 3. Atherosclerotic aortic arch. 4. Suspected emphysema. 5. Chronic right seventh rib deformity posterolaterally.   Electronically Signed   By: Herbie Baltimore M.D.   On: 07/20/2014 18:02   Ct Head Wo Contrast  07/20/2014   CLINICAL DATA:  Loss of consciousness.  EXAM: CT HEAD WITHOUT CONTRAST  TECHNIQUE: Contiguous axial images were obtained from the base of the skull through the vertex without intravenous contrast.  COMPARISON:  10/27/2008  FINDINGS: Ventricles and cisterns are within normal. There is mild age related atrophic change. There is a moderate size old right MCA territory infarct as well as old infarcts over the left cerebellum and left occipital region. There is mild chronic ischemic microvascular disease. There is no mass, mass effect, shift of midline structures or acute hemorrhage. There is no evidence to suggest acute infarction. Remainder the exam is unchanged.  IMPRESSION: No acute intracranial findings.  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   Electronically Signed   By: Elberta Fortis M.D.   On: 07/20/2014 16:51    Imaging Review No results found.   EKG Interpretation   Date/Time:  Sunday July 20 2014 15:34:03 EDT Ventricular Rate:  51 PR Interval:  197 QRS Duration: 144 QT Interval:  482 QTC Calculation: 444 R Axis:   -93 Text Interpretation:  Sinus rhythm Right bundle branch block Confirmed by  ZACKOWSKI  MD, SCOTT ([id number]) on 07/20/2014 4:29:14 PM      MDM   Final diagnoses:  Syncope, unspecified syncope type  Diarrhea  Type II or unspecified type diabetes mellitus without mention of complication, not stated as uncontrolled  Unspecified cerebral artery occlusion with cerebral infarction  Bradycardia   Patient presents after syncopal event at home  lasting approximately 10 minutes. Patient's family reports recent history of diarrhea approximately 25 episodes yesterday, and no recent antibiotic use. Syncope likely do to dehydration, blood pressure 93/18 on arrival bolus given patient is also bradycardic.  Dr. Deretha Emory also evaluated the patient during this encounter. Chest x-ray shows moderate right pleural effusion. CT head shows previous CVAs no acute findings. Patient's blood pressure responded to fluids, plan to admit. Discussed pt history with hospitalist Dr. Tawni Pummel agrees to admit the patient.   Meds given in ED:  Medications  sodium chloride 0.9 % bolus 1,000 mL (1,000  mLs Intravenous New Bag/Given 07/20/14 1717)    New Prescriptions   No medications on file      Mellody Drown, PA-C 07/21/14 0147  Mellody Drown, PA-C 07/21/14 1610

## 2014-07-20 NOTE — ED Notes (Signed)
Pt to department via EMS from home- pt reports that he was at the kitchen table today, slumped over, and became unresponsive for several minutes witnessed by daughter.  Daughter reports she could not find a pulse to 911.  Pt A&O but slow to response.  Denies pain, does not remember losing consciousness. Hr-50 Bp-90/60 cbg-130

## 2014-07-20 NOTE — Progress Notes (Signed)
ABG obtained was a VENOUS sample obtained from R brachial site. MD aware of venous instead of arterial.

## 2014-07-21 DIAGNOSIS — I739 Peripheral vascular disease, unspecified: Secondary | ICD-10-CM | POA: Diagnosis present

## 2014-07-21 DIAGNOSIS — I10 Essential (primary) hypertension: Secondary | ICD-10-CM

## 2014-07-21 DIAGNOSIS — D509 Iron deficiency anemia, unspecified: Secondary | ICD-10-CM

## 2014-07-21 DIAGNOSIS — R55 Syncope and collapse: Secondary | ICD-10-CM

## 2014-07-21 DIAGNOSIS — J90 Pleural effusion, not elsewhere classified: Secondary | ICD-10-CM

## 2014-07-21 LAB — CBC
HCT: 22.9 % — ABNORMAL LOW (ref 39.0–52.0)
HEMOGLOBIN: 7.4 g/dL — AB (ref 13.0–17.0)
MCH: 30.5 pg (ref 26.0–34.0)
MCHC: 32.3 g/dL (ref 30.0–36.0)
MCV: 94.2 fL (ref 78.0–100.0)
PLATELETS: 98 10*3/uL — AB (ref 150–400)
RBC: 2.43 MIL/uL — ABNORMAL LOW (ref 4.22–5.81)
RDW: 16.7 % — AB (ref 11.5–15.5)
WBC: 3.7 10*3/uL — ABNORMAL LOW (ref 4.0–10.5)

## 2014-07-21 LAB — GLUCOSE, CAPILLARY
GLUCOSE-CAPILLARY: 82 mg/dL (ref 70–99)
GLUCOSE-CAPILLARY: 86 mg/dL (ref 70–99)
Glucose-Capillary: 66 mg/dL — ABNORMAL LOW (ref 70–99)
Glucose-Capillary: 88 mg/dL (ref 70–99)
Glucose-Capillary: 124 mg/dL — ABNORMAL HIGH (ref 70–99)

## 2014-07-21 LAB — HEMOGLOBIN A1C
HEMOGLOBIN A1C: 6.1 % — AB (ref <5.7)
Hgb A1c MFr Bld: 6.1 % — ABNORMAL HIGH (ref <5.7)
MEAN PLASMA GLUCOSE: 128 mg/dL — AB (ref <117)
Mean Plasma Glucose: 128 mg/dL — ABNORMAL HIGH (ref <117)

## 2014-07-21 LAB — BASIC METABOLIC PANEL
Anion gap: 8 (ref 5–15)
BUN: 18 mg/dL (ref 6–23)
CHLORIDE: 107 meq/L (ref 96–112)
CO2: 25 mEq/L (ref 19–32)
Calcium: 7.7 mg/dL — ABNORMAL LOW (ref 8.4–10.5)
Creatinine, Ser: 0.9 mg/dL (ref 0.50–1.35)
GFR calc Af Amer: >90 mL/min (ref >90)
GFR calc non Af Amer: 80 mL/min — ABNORMAL LOW (ref >90)
Glucose, Bld: 150 mg/dL — ABNORMAL HIGH (ref 70–99)
Potassium: 3.7 mEq/L (ref 3.7–5.3)
Sodium: 140 mEq/L (ref 137–147)

## 2014-07-21 LAB — TROPONIN I: Troponin I: <0.3 ng/mL (ref <0.30)

## 2014-07-21 MED ORDER — ENSURE COMPLETE PO LIQD
237.0000 mL | Freq: Two times a day (BID) | ORAL | Status: DC
  Administered 2014-07-21 – 2014-07-25 (×6): 237 mL via ORAL

## 2014-07-21 NOTE — Progress Notes (Signed)
*  PRELIMINARY RESULTS* Vascular Ultrasound Carotid Duplex (Doppler) has been completed. Unable to detect flow with color and pulsed wave doppler in the right common carotid artery. This, along with severe heterogenous plaque in the right common carotid artery is suggestive of occlusion. There is no flow in the right internal carotid artery, however there is only mild to moderate plaque formation and the vessel lumen is still anechoic. There is no flow noted in the right external carotid. The right vertebral artery is patent with antegrade flow.  The left internal carotid artery is patent with 1-39% stenosis. The left vertebral artery is patent with antegrade flow.  07/21/2014 4:15 PM Gertie Fey, RVT, RDCS, RDMS

## 2014-07-21 NOTE — Progress Notes (Addendum)
Hypoglycemic Event  CBG: 66 at 0803 on 07/21/2014  Treatment: orange juice and breakfast  Symptoms: none  Follow-up CBG: Time: 0842 CBG Result:88  Possible Reasons for Event: unknown  Comments/MD notified: dr. Nicanor Alcon, Deeann Saint  Remember to initiate Hypoglycemia Order Set & complete

## 2014-07-21 NOTE — Progress Notes (Signed)
INITIAL NUTRITION ASSESSMENT  DOCUMENTATION CODES Per approved criteria  -Severe malnutrition in the context of chronic illness -Underweight   INTERVENTION: Ensure Complete po BID, each supplement provides 350 kcal and 13 grams of protein  NUTRITION DIAGNOSIS: Malnutrition related to chronic disease as evidenced by severe fat and muscle depletion .   Goal: Pt to meet >/= 90% of their estimated nutrition needs   Monitor:  Diet advancement, PO intake, weight trend, labs  Reason for Assessment: Pt identified as at nutrition risk on the Malnutrition Screen Tool  79 y.o. male  Admitting Dx: Syncope  ASSESSMENT: Pt admitted with syncope, work-up underway. No family present. Pt does not feel that he has lost any weight recently and feels he eats ok. Per RN pt ate well this am.  Pt has bilateral AKA's.  Nutrition Focused Physical Exam:  Subcutaneous Fat:  Orbital Region: WDL Upper Arm Region: severe depletion Thoracic and Lumbar Region:  Mild/moderate depletion   Muscle:  Temple Region: severe depletion  Clavicle Bone Region: severe depletion  Clavicle and Acromion Bone Region: severe depletion  Scapular Bone Region: severe depletion  Dorsal Hand: severe depletion  Patellar Region: NA Anterior Thigh Region: NA Posterior Calf Region: NA  Edema: not present  Height: Ht Readings from Last 1 Encounters:  07/21/14  (1.854 m)    Weight: Wt Readings from Last 1 Encounters:  07/21/14 103 lb (46.72 kg)    Ideal Body Weight: 70.2 kg (Adjusted for bilateral AKA's)  % Ideal Body Weight: 67%  Wt Readings from Last 10 Encounters:  07/21/14 103 lb (46.72 kg)    Usual Body Weight: unknown  % Usual Body Weight: -  BMI:  16.2 - underweight (adjsuted for bilateral AKA's)  Estimated Nutritional Needs: Kcal: 1500-1700 Protein: 75-85 grams Fluid: >1.5 L/day  Skin: bilateral amputations  Diet Order: Carb Control Meal Completion: 75% this am  EDUCATION  NEEDS: -No education needs identified at this time   Intake/Output Summary (Last 24 hours) at 07/21/14 1112 Last data filed at 07/21/14 1000  Gross per 24 hour  Intake    403 ml  Output    150 ml  Net    253 ml    Last BM: 9/6   Labs:   Recent Labs Lab 07/20/14 1545 07/21/14 0320  NA 138 140  K 4.4 3.7  CL 103 107  CO2 23 25  BUN 17 18  CREATININE 0.87 0.90  CALCIUM 8.4 7.7*  GLUCOSE 159* 150*    CBG (last 3)   Recent Labs  07/20/14 2133 07/21/14 0803 07/21/14 0842  GLUCAP 115* 66* 88    Scheduled Meds: . atorvastatin  80 mg Oral Daily  . clopidogrel  75 mg Oral Daily  . enoxaparin (LOVENOX) injection  40 mg Subcutaneous Q24H  . insulin aspart  0-5 Units Subcutaneous QHS  . insulin aspart  0-9 Units Subcutaneous TID WC  . pioglitazone  45 mg Oral Daily  . sodium chloride  3 mL Intravenous Q12H    Continuous Infusions: . sodium chloride 75 mL/hr at 07/21/14 1054    Past Medical History  Diagnosis Date  . Diabetes mellitus   . Hypertension   . Myocardial infarction 2005  . Foot pain     while lying  . Blood clot in vein   . Blindness temporary   . Joint pain   . Loss of appetite   . Hyperlipidemia   . CAD (coronary artery disease)   . Stroke  resulting in left side weakness    Past Surgical History  Procedure Laterality Date  . Pr vein bypass graft,aorto-fem-pop    . Above knee leg amputation      right    Kendell Bane RD, LDN, CNSC [phone] Pager [phone] After Hours Pager

## 2014-07-21 NOTE — Progress Notes (Signed)
UR Completed Dhamar Gregory Graves-Bigelow, RN,BSN [phone]  

## 2014-07-21 NOTE — Progress Notes (Addendum)
[facility] PROGRESS NOTE  Assessment/Plan: Syncope: - cardiac markers negative x2, no events on telemetry. - history of stroke and BKA, check MRI. - ECHo pending.  PVD (peripheral vascular disease) with B/L BKA: - cont plavix.  Pleural effusion, right - consult IR Eval and treat.  Essential hypertension, benign - controlled on metorpolol.  H/o CVA - on plavix non focal. - check MRI  Type II or unspecified type diabetes mellitus without mention of complication, not stated as uncontrolled - check A1c. - good controlled. Cont actor  Microcytic anemia: - unclear etiology  Code Status: full Family Communication: none  Disposition Plan: inpatient   Consultants:  none  Procedures:  MRI  ECHO   Antibiotics:  none (indicate start date, and stop date if known)  HPI/Subjective: No complains except he wants the catheter out.  he relates he as had watery stools this am but the nobody has clean him.  Objective: Filed Vitals:   07/20/14 2033 07/20/14 2044 07/20/14 2117 07/21/14 0623  BP: 125/103  128/103 122/35  Pulse: 55  61 59  Temp: 98.2 F (36.8 C) 96 F (35.6 C) 98.3 F (36.8 C) 97.7 F (36.5 C)  TempSrc: Oral Rectal Oral Axillary  Resp: Height:   3' (0.914 m)   Weight:   47.083 kg (103 lb 12.8 oz)   SpO2: 100%   93%    Intake/Output Summary (Last 24 hours) at 07/21/14 1011 Last data filed at 07/21/14 0500  Gross per 24 hour  Intake      3 ml  Output    150 ml  Net   -147 ml   Filed Weights   07/20/14 2117  Weight: 47.083 kg (103 lb 12.8 oz)    Exam:  General: Alert, awake, oriented x3, in no acute distress.  HEENT: No bruits, no goiter.  Heart: Regular rate and rhythm. Lungs: Good air movement, clear Abdomen: Soft, nontender, nondistended, positive bowel sounds.   Data Reviewed: Basic Metabolic Panel:  Recent Labs Lab 07/20/14 1545 07/21/14 0320  NA 138 140  K 4.4 3.7  CL 103 107  CO2 23 25  GLUCOSE  159* 150*  BUN 17 18  CREATININE 0.87 0.90  CALCIUM 8.4 7.7*   Liver Function Tests: No results found for this basename: AST, ALT, ALKPHOS, BILITOT, PROT, ALBUMIN,  in the last 168 hours No results found for this basename: LIPASE, AMYLASE,  in the last 168 hours No results found for this basename: AMMONIA,  in the last 168 hours CBC:  Recent Labs Lab 07/20/14 1545 07/21/14 0320  WBC 5.0 3.7*  NEUTROABS 3.9  --   HGB 9.0* 7.4*  HCT 27.4* 22.9*  MCV 93.8 94.2  PLT 128* 98*   Cardiac Enzymes:  Recent Labs Lab 07/20/14 2158 07/21/14 0320  TROPONINI <0.30 <0.30   BNP (last 3 results) No results found for this basename: PROBNP,  in the last 8760 hours CBG:  Recent Labs Lab 07/20/14 2133 07/21/14 0803 07/21/14 0842  GLUCAP 115* 66* 88    No results found for this or any previous visit (from the past 240 hour(s)).   Studies: Dg Chest 2 View  07/20/2014   CLINICAL DATA:  Syncope  EXAM: CHEST  2 VIEW  COMPARISON:  Multiple exams, including 05/13/2010  FINDINGS: The peripheral lucency over the left hemithorax is probably due to a large wrinkling of the skin although its position makes it difficult to see lung markings extending peripheral to it.  While I doubt that this is a pneumothorax, further imaging may be required if the patient is breath sounds are distant on the left.  Moderate right pleural effusion. Suspected emphysema. Chronic right seventh rib deformity. Atherosclerotic vascular calcifications. Heart size within normal limits.  IMPRESSION: 1. Moderate right pleural effusion. 2. I suspect that the lucency over the left lateral chest is due to a skin fold rather than pneumothorax ; if left-sided chest sounds are distant than further imaging workup may be warranted. 3. Atherosclerotic aortic arch. 4. Suspected emphysema. 5. Chronic right seventh rib deformity posterolaterally.   Electronically Signed   By: Herbie Baltimore M.D.   On: 07/20/2014 18:02   Ct Head Wo  Contrast  07/20/2014   CLINICAL DATA:  Loss of consciousness.  EXAM: CT HEAD WITHOUT CONTRAST  TECHNIQUE: Contiguous axial images were obtained from the base of the skull through the vertex without intravenous contrast.  COMPARISON:  10/27/2008  FINDINGS: Ventricles and cisterns are within normal. There is mild age related atrophic change. There is a moderate size old right MCA territory infarct as well as old infarcts over the left cerebellum and left occipital region. There is mild chronic ischemic microvascular disease. There is no mass, mass effect, shift of midline structures or acute hemorrhage. There is no evidence to suggest acute infarction. Remainder the exam is unchanged.  IMPRESSION: No acute intracranial findings.  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   Electronically Signed   By: Elberta Fortis M.D.   On: 07/20/2014 16:51    Scheduled Meds: . atorvastatin  80 mg Oral Daily  . clopidogrel  75 mg Oral Daily  . enoxaparin (LOVENOX) injection  40 mg Subcutaneous Q24H  . insulin aspart  0-5 Units Subcutaneous QHS  . insulin aspart  0-9 Units Subcutaneous TID WC  . pioglitazone  45 mg Oral Daily  . sodium chloride  3 mL Intravenous Q12H   Continuous Infusions: . sodium chloride 75 mL/hr at 07/20/14 2300     [name]  [facility] Pager [phone]. If 8PM-8AM, please contact night-coverage at [email or web address], password [facility] 07/21/2014, 10:11 AM  LOS: 1 day    **Disclaimer: This note may have been dictated with voice recognition software. Similar sounding words can inadvertently be transcribed and this note may contain transcription errors which may not have been corrected upon publication of note.**

## 2014-07-22 ENCOUNTER — Observation Stay (HOSPITAL_COMMUNITY): Payer: Medicare Other

## 2014-07-22 DIAGNOSIS — R55 Syncope and collapse: Secondary | ICD-10-CM

## 2014-07-22 DIAGNOSIS — I369 Nonrheumatic tricuspid valve disorder, unspecified: Secondary | ICD-10-CM

## 2014-07-22 DIAGNOSIS — E43 Unspecified severe protein-calorie malnutrition: Secondary | ICD-10-CM

## 2014-07-22 LAB — LACTATE DEHYDROGENASE: LDH: 186 U/L (ref 94–250)

## 2014-07-22 LAB — GLUCOSE, CAPILLARY
Glucose-Capillary: 101 mg/dL — ABNORMAL HIGH (ref 70–99)
Glucose-Capillary: 71 mg/dL (ref 70–99)
Glucose-Capillary: 126 mg/dL — ABNORMAL HIGH (ref 70–99)

## 2014-07-22 LAB — HEPATIC FUNCTION PANEL
ALK PHOS: 55 U/L (ref 39–117)
ALT: 9 U/L (ref 0–53)
AST: 16 U/L (ref 0–37)
Albumin: 2.5 g/dL — ABNORMAL LOW (ref 3.5–5.2)
BILIRUBIN TOTAL: 0.3 mg/dL (ref 0.3–1.2)
Bilirubin, Direct: <0.2 mg/dL (ref 0.0–0.3)
Total Protein: 5.2 g/dL — ABNORMAL LOW (ref 6.0–8.3)

## 2014-07-22 LAB — CLOSTRIDIUM DIFFICILE BY PCR: CDIFFPCR: NEGATIVE

## 2014-07-22 NOTE — Progress Notes (Signed)
Pt stool specimen negative for CDiff. Contact (enteric) discontinued/removed from pt door, pt and daughter at bedside informed.

## 2014-07-22 NOTE — Progress Notes (Signed)
  Echocardiogram 2D Echocardiogram has been performed.  Janalyn Harder 07/22/2014, 3:03 PM

## 2014-07-22 NOTE — Progress Notes (Signed)
Utilization review completed.  

## 2014-07-22 NOTE — Progress Notes (Signed)
[facility] PROGRESS NOTE  Assessment/Plan: Syncope: - Cardiac markers negative x2, no events on telemetry. - History of stroke and BKA, check MRI. - ECHo as below. - Carotid dop: as below. - UDS negative.  PVD (peripheral vascular disease) with B/L BKA: - Cont plavix.  Pleural effusion, right - consult IR Eval and treat.  Essential hypertension, benign - controlled on metorpolol.  H/o CVA - on plavix non focal. - check MRI  Type II or unspecified type diabetes mellitus without mention of complication, not stated as uncontrolled - check A1c. - good controlled. Cont actor  Microcytic anemia: - unclear etiology  Code Status: full Family Communication: none  Disposition Plan: inpatient   Consultants:  none  Procedures:  MRI pending  ECHO Carotid Duplex (Doppler) has been completed.  Unable to detect flow with color and pulsed wave doppler in the right common carotid artery. This, along with severe heterogenous plaque in the right common carotid artery is suggestive of occlusion. There is no flow in the right internal carotid artery, however there is only mild to moderate plaque formation and the vessel lumen is still anechoic. There is no flow noted in the right external carotid. The right vertebral artery is patent with antegrade flow.     Antibiotics: none  HPI/Subjective: No complains. Objective: Filed Vitals:   07/21/14 0900 07/21/14 1351 07/21/14 2138 07/22/14 0500  BP:  116/29 114/26 104/43  Pulse:  116 64 51  Temp:  97.3 F (36.3 C) 98.2 F (36.8 C) 98.1 F (36.7 C)  TempSrc:  Axillary Oral Oral  Resp:  Height:  (1.854 m)     Weight: 46.72 kg (103 lb)     SpO2:  94% 100% 99%    Intake/Output Summary (Last 24 hours) at 07/22/14 0916 Last data filed at 07/22/14 0400  Gross per 24 hour  Intake 1752.5 ml  Output    100 ml  Net 1652.5 ml   Filed Weights   07/20/14 2117 07/21/14 0900  Weight: 47.083 kg (103 lb 12.8  oz) 46.72 kg (103 lb)    Exam:  General: Alert, awake, oriented x3, in no acute distress.  HEENT: No bruits, no goiter.  Heart: Regular rate and rhythm. Lungs: Good air movement, clear Abdomen: Soft, nontender, nondistended, positive bowel sounds.   Data Reviewed: Basic Metabolic Panel:  Recent Labs Lab 07/20/14 1545 07/21/14 0320  NA 138 140  K 4.4 3.7  CL 103 107  CO2 23 25  GLUCOSE 159* 150*  BUN 17 18  CREATININE 0.87 0.90  CALCIUM 8.4 7.7*   Liver Function Tests: No results found for this basename: AST, ALT, ALKPHOS, BILITOT, PROT, ALBUMIN,  in the last 168 hours No results found for this basename: LIPASE, AMYLASE,  in the last 168 hours No results found for this basename: AMMONIA,  in the last 168 hours CBC:  Recent Labs Lab 07/20/14 1545 07/21/14 0320  WBC 5.0 3.7*  NEUTROABS 3.9  --   HGB 9.0* 7.4*  HCT 27.4* 22.9*  MCV 93.8 94.2  PLT 128* 98*   Cardiac Enzymes:  Recent Labs Lab 07/20/14 2158 07/21/14 0320 07/21/14 1000  TROPONINI <0.30 <0.30 <0.30   BNP (last 3 results) No results found for this basename: PROBNP,  in the last 8760 hours CBG:  Recent Labs Lab 07/21/14 0842 07/21/14 1136 07/21/14 1632 07/21/14 2127 07/22/14 0736  GLUCAP 88 82 86 124* 101*    No results found for this or any  previous visit (from the past 240 hour(s)).   Studies: Dg Chest 2 View  07/20/2014   CLINICAL DATA:  Syncope  EXAM: CHEST  2 VIEW  COMPARISON:  Multiple exams, including 05/13/2010  FINDINGS: The peripheral lucency over the left hemithorax is probably due to a large wrinkling of the skin although its position makes it difficult to see lung markings extending peripheral to it. While I doubt that this is a pneumothorax, further imaging may be required if the patient is breath sounds are distant on the left.  Moderate right pleural effusion. Suspected emphysema. Chronic right seventh rib deformity. Atherosclerotic vascular calcifications. Heart size within  normal limits.  IMPRESSION: 1. Moderate right pleural effusion. 2. I suspect that the lucency over the left lateral chest is due to a skin fold rather than pneumothorax ; if left-sided chest sounds are distant than further imaging workup may be warranted. 3. Atherosclerotic aortic arch. 4. Suspected emphysema. 5. Chronic right seventh rib deformity posterolaterally.   Electronically Signed   By: Herbie Baltimore M.D.   On: 07/20/2014 18:02   Ct Head Wo Contrast  07/20/2014   CLINICAL DATA:  Loss of consciousness.  EXAM: CT HEAD WITHOUT CONTRAST  TECHNIQUE: Contiguous axial images were obtained from the base of the skull through the vertex without intravenous contrast.  COMPARISON:  10/27/2008  FINDINGS: Ventricles and cisterns are within normal. There is mild age related atrophic change. There is a moderate size old right MCA territory infarct as well as old infarcts over the left cerebellum and left occipital region. There is mild chronic ischemic microvascular disease. There is no mass, mass effect, shift of midline structures or acute hemorrhage. There is no evidence to suggest acute infarction. Remainder the exam is unchanged.  IMPRESSION: No acute intracranial findings.  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   Electronically Signed   By: Elberta Fortis M.D.   On: 07/20/2014 16:51    Scheduled Meds: . atorvastatin  80 mg Oral Daily  . clopidogrel  75 mg Oral Daily  . enoxaparin (LOVENOX) injection  40 mg Subcutaneous Q24H  . feeding supplement (ENSURE COMPLETE)  237 mL Oral BID BM  . insulin aspart  0-5 Units Subcutaneous QHS  . insulin aspart  0-9 Units Subcutaneous TID WC  . pioglitazone  45 mg Oral Daily  . sodium chloride  3 mL Intravenous Q12H   Continuous Infusions: . sodium chloride 75 mL/hr at 07/21/14 2218     Marinda Elk  [facility] Pager [phone]. If 8PM-8AM, please contact night-coverage at [email or web address], password  [facility] 07/22/2014, 9:16 AM  LOS: 2 days    **Disclaimer: This note may have been dictated with voice recognition software. Similar sounding words can inadvertently be transcribed and this note may contain transcription errors which may not have been corrected upon publication of note.**

## 2014-07-23 ENCOUNTER — Encounter (HOSPITAL_COMMUNITY): Payer: Self-pay | Admitting: Radiology

## 2014-07-23 ENCOUNTER — Observation Stay (HOSPITAL_COMMUNITY): Payer: Medicare Other

## 2014-07-23 DIAGNOSIS — D696 Thrombocytopenia, unspecified: Secondary | ICD-10-CM

## 2014-07-23 DIAGNOSIS — D649 Anemia, unspecified: Secondary | ICD-10-CM

## 2014-07-23 DIAGNOSIS — D62 Acute posthemorrhagic anemia: Secondary | ICD-10-CM | POA: Diagnosis present

## 2014-07-23 LAB — BASIC METABOLIC PANEL
Anion gap: 11 (ref 5–15)
BUN: 17 mg/dL (ref 6–23)
CALCIUM: 7.8 mg/dL — AB (ref 8.4–10.5)
CO2: 22 meq/L (ref 19–32)
Chloride: 104 mEq/L (ref 96–112)
Creatinine, Ser: 0.66 mg/dL (ref 0.50–1.35)
GFR calc Af Amer: >90 mL/min (ref >90)
GFR calc non Af Amer: >90 mL/min (ref >90)
GLUCOSE: 111 mg/dL — AB (ref 70–99)
Potassium: 4.1 mEq/L (ref 3.7–5.3)
Sodium: 137 mEq/L (ref 137–147)

## 2014-07-23 LAB — IRON AND TIBC
Iron: 52 ug/dL (ref 42–135)
Saturation Ratios: 29 % (ref 20–55)
TIBC: 181 ug/dL — ABNORMAL LOW (ref 215–435)
UIBC: 129 ug/dL (ref 125–400)

## 2014-07-23 LAB — CBC
HCT: 19.7 % — ABNORMAL LOW (ref 39.0–52.0)
HEMATOCRIT: 19.7 % — AB (ref 39.0–52.0)
HEMOGLOBIN: 6.4 g/dL — AB (ref 13.0–17.0)
Hemoglobin: 6.3 g/dL — CL (ref 13.0–17.0)
MCH: 30.2 pg (ref 26.0–34.0)
MCH: 29.7 pg (ref 26.0–34.0)
MCHC: 32.5 g/dL (ref 30.0–36.0)
MCHC: 32 g/dL (ref 30.0–36.0)
MCV: 92.9 fL (ref 78.0–100.0)
MCV: 92.9 fL (ref 78.0–100.0)
Platelets: 94 10*3/uL — ABNORMAL LOW (ref 150–400)
Platelets: 89 10*3/uL — ABNORMAL LOW (ref 150–400)
RBC: 2.12 MIL/uL — ABNORMAL LOW (ref 4.22–5.81)
RBC: 2.12 MIL/uL — ABNORMAL LOW (ref 4.22–5.81)
RDW: 17 % — ABNORMAL HIGH (ref 11.5–15.5)
RDW: 16.9 % — AB (ref 11.5–15.5)
WBC: 4 10*3/uL (ref 4.0–10.5)
WBC: 3.7 10*3/uL — AB (ref 4.0–10.5)

## 2014-07-23 LAB — PREPARE RBC (CROSSMATCH)

## 2014-07-23 LAB — GLUCOSE, CAPILLARY
GLUCOSE-CAPILLARY: 99 mg/dL (ref 70–99)
Glucose-Capillary: 82 mg/dL (ref 70–99)
Glucose-Capillary: 122 mg/dL — ABNORMAL HIGH (ref 70–99)

## 2014-07-23 LAB — PROTIME-INR
INR: 1.19 (ref 0.00–1.49)
PROTHROMBIN TIME: 15.1 s (ref 11.6–15.2)

## 2014-07-23 LAB — LACTATE DEHYDROGENASE: LDH: 203 U/L (ref 94–250)

## 2014-07-23 LAB — VITAMIN B12: VITAMIN B 12: 213 pg/mL (ref 211–911)

## 2014-07-23 LAB — RETICULOCYTES
RBC.: 2.12 MIL/uL — ABNORMAL LOW (ref 4.22–5.81)
RETIC COUNT ABSOLUTE: 31.8 10*3/uL (ref 19.0–186.0)
RETIC CT PCT: 1.5 % (ref 0.4–3.1)

## 2014-07-23 LAB — FOLATE: FOLATE: 11.8 ng/mL

## 2014-07-23 LAB — FERRITIN: Ferritin: 73 ng/mL (ref 22–322)

## 2014-07-23 MED ORDER — SODIUM CHLORIDE 0.9 % IV SOLN
Freq: Once | INTRAVENOUS | Status: AC
  Administered 2014-07-23: 17:00:00 via INTRAVENOUS

## 2014-07-23 MED ORDER — IOHEXOL 350 MG/ML SOLN
50.0000 mL | Freq: Once | INTRAVENOUS | Status: AC | PRN
  Administered 2014-07-23: 50 mL via INTRAVENOUS

## 2014-07-23 MED ORDER — LIDOCAINE HCL (PF) 1 % IJ SOLN
INTRAMUSCULAR | Status: AC
  Filled 2014-07-23: qty 10

## 2014-07-23 NOTE — Progress Notes (Signed)
[facility] Progress Note   Armarion Greek ZOX:[id number] DOB: 01/25/1935 DOA: 07/20/2014 PCP: Dorrene German, MD  Brief narrative: [name] is a 79 y.o. male with past medical history significant for diabetes mellitus hypertension and coronary artery disease in addition to multiple strokes in the past presenting with a syncopal episode noted by his daughter. The patient slumped to the right and the family could not wake him up. It lasted for 5-10 minutes, no seizure activity noted, no complaints of chest pains or palpitations. The patient is bedbound and stays at home with his daughter who takes care of all activities of daily living.  Subjective: Alert, aware that he is in the hospital for passing out. He has no complaints.   Assessment/Plan: Principal Problem:   Syncope -  work up has including the following - cardiac markers which were negative -  MRI which does not reveal any acute abnormalities - ECHO - EF 55-60 % and grade 1 diastolic dysfunction - Carotid dopplers- reveals a probably chronic occlusion of the right ICA- will check CTA  Active Problems: Anemia - Hgb dropping from 9.0 to 6.4 today with no overt signs of bleeding - will check LDH, haptoglobin, stool hemoccult, repeat CBC to ensure accuracy and obtain an anemia panel - transfuse 1 U PRBC  Thrombocytopenia - acute as well- check smear, PT/PTT - cont Plavix for now and monitor for bleeding  PVD with b/l BKA - cont Plavix    Type II or unspecified type diabetes mellitus without mention of complication, not stated as uncontrolled - A1c 6.1 - cont sliding scale insulin and Actos    CVA Cont Plavix- no acute event noted    Pleural effusion, right - acute? Sent for thoracentesis but not enough fluid to drain    Essential hypertension, benign -  Metoprolol has been on hold- BP not elevated- will cont to hold  Code Status: Full code Family Communication: none Disposition Plan: to be determined DVT  prophylaxis: SCDs  Consultants: none  Procedures: none  Antibiotics: Anti-infectives   None      Objective: Filed Weights   07/20/14 2117 07/21/14 0900 07/23/14 0400  Weight: 47.083 kg (103 lb 12.8 oz) 46.72 kg (103 lb) 47 kg (103 lb 9.9 oz)    Intake/Output Summary (Last 24 hours) at 07/23/14 1308 Last data filed at 07/23/14 1253  Gross per 24 hour  Intake 488.75 ml  Output    400 ml  Net  88.75 ml     Vitals Filed Vitals:   07/22/14 2035 07/23/14 0000 07/23/14 0400 07/23/14 1300  BP: 127/34 104/45 121/34 92/24  Pulse: 68 62 71 68  Temp: 98.1 F (36.7 C) 97.7 F (36.5 C) 97.7 F (36.5 C) 98.1 F (36.7 C)  TempSrc: Oral Oral Oral Axillary  Resp:  Height:      Weight:   47 kg (103 lb 9.9 oz)   SpO2:  100% 100%     Exam: General: No acute respiratory distress Lungs: Clear to auscultation bilaterally without wheezes or crackles Cardiovascular: Regular rate and rhythm without murmur gallop or rub normal S1 and S2 Abdomen: Nontender, nondistended, soft, bowel sounds positive, no rebound, no ascites, no appreciable mass Extremities: b/l BKA  Data Reviewed: Basic Metabolic Panel:  Recent Labs Lab 07/20/14 1545 07/21/14 0320  NA 138 140  K 4.4 3.7  CL 103 107  CO2 23 25  GLUCOSE 159* 150*  BUN 17 18  CREATININE 0.87 0.90  CALCIUM 8.4  7.7*   Liver Function Tests:  Recent Labs Lab 07/22/14 1350  AST 16  ALT 9  ALKPHOS 55  BILITOT 0.3  PROT 5.2*  ALBUMIN 2.5*   No results found for this basename: LIPASE, AMYLASE,  in the last 168 hours No results found for this basename: AMMONIA,  in the last 168 hours CBC:  Recent Labs Lab 07/20/14 1545 07/21/14 0320 07/23/14 1137  WBC 5.0 3.7* 4.0  NEUTROABS 3.9  --   --   HGB 9.0* 7.4* 6.4*  HCT 27.4* 22.9* 19.7*  MCV 93.8 94.2 92.9  PLT 128* 98* 94*   Cardiac Enzymes:  Recent Labs Lab 07/20/14 2158 07/21/14 0320 07/21/14 1000  TROPONINI <0.30 <0.30 <0.30   BNP (last 3  results) No results found for this basename: PROBNP,  in the last 8760 hours CBG:  Recent Labs Lab 07/22/14 1621 07/22/14 2107 07/23/14 0721 07/23/14 1155 07/23/14 1225  GLUCAP 71 126* 82 99 122*    Recent Results (from the past 240 hour(s))  CULTURE, BLOOD (ROUTINE X 2)     Status: None   Collection Time    07/20/14  5:27 PM      Result Value Ref Range Status   Specimen Description BLOOD RIGHT HAND   Final   Special Requests BOTTLES DRAWN AEROBIC AND ANAEROBIC 5 CC   Final   Culture  Setup Time     Final   Value: 07/21/2014 00:26     Performed at [facility]   Culture     Final   Value:        BLOOD CULTURE RECEIVED NO GROWTH TO DATE CULTURE WILL BE HELD FOR 5 DAYS BEFORE ISSUING A FINAL NEGATIVE REPORT     Performed at [facility]   Report Status PENDING   Incomplete  CULTURE, BLOOD (ROUTINE X 2)     Status: None   Collection Time    07/20/14  6:13 PM      Result Value Ref Range Status   Specimen Description BLOOD RIGHT HAND   Final   Special Requests BOTTLES DRAWN AEROBIC ONLY 3CC   Final   Culture  Setup Time     Final   Value: 07/21/2014 00:26     Performed at [facility]   Culture     Final   Value:        BLOOD CULTURE RECEIVED NO GROWTH TO DATE CULTURE WILL BE HELD FOR 5 DAYS BEFORE ISSUING A FINAL NEGATIVE REPORT     Performed at [facility]   Report Status PENDING   Incomplete  CLOSTRIDIUM DIFFICILE BY PCR     Status: None   Collection Time    07/22/14  3:51 AM      Result Value Ref Range Status   C difficile by pcr NEGATIVE  NEGATIVE Final  STOOL CULTURE     Status: None   Collection Time    07/22/14  3:51 AM      Result Value Ref Range Status   Specimen Description STOOL   Final   Special Requests NONE   Final   Culture     Final   Value: NO SUSPICIOUS COLONIES, CONTINUING TO HOLD     Performed at [facility]   Report Status PENDING   Incomplete     Studies:  Recent x-ray studies have been reviewed in  detail by the Attending Physician  Scheduled Meds:  Scheduled Meds: . sodium chloride   Intravenous  Once  . atorvastatin  80 mg Oral Daily  . clopidogrel  75 mg Oral Daily  . enoxaparin (LOVENOX) injection  40 mg Subcutaneous Q24H  . feeding supplement (ENSURE COMPLETE)  237 mL Oral BID BM  . insulin aspart  0-5 Units Subcutaneous QHS  . insulin aspart  0-9 Units Subcutaneous TID WC  . lidocaine (PF)      . pioglitazone  45 mg Oral Daily  . sodium chloride  3 mL Intravenous Q12H   Continuous Infusions: . sodium chloride 75 mL/hr at 07/23/14 0516    Time spent on care of this patient: 40 min   Md Smola, MD 07/23/2014, 1:08 PM  LOS: 3 days   [facility] Office  [phone] Pager - Text Page per [email or web address]  If 7PM-7AM, please contact night-coverage [email or web address]

## 2014-07-23 NOTE — Progress Notes (Signed)
Utilization review completed.  

## 2014-07-23 NOTE — Progress Notes (Signed)
RT Note: Late Entry  Called to patients room by RN to access SPO2.  Upon arrival SpO2 on 2.5 lpm  was 100% HR 62.  RN made aware.

## 2014-07-23 NOTE — Progress Notes (Signed)
Patient ID: [name], male   DOB: 28-Jul-1935, 79 y.o.   MRN: [id number] Request received for US guided right thoracentesis on pt. Limited US post rt chest today reveals no sig rt effusion . Procedure was not done. Trace left effusion noted not safely amenable for percutaneous access now.

## 2014-07-23 NOTE — Progress Notes (Signed)
Critical Lab Hemoglobin 6.4 received at 1226 from [facility], face to face reported to Dr. Butler Denmark at 1230. Yvette Rack, RN

## 2014-07-24 DIAGNOSIS — I739 Peripheral vascular disease, unspecified: Secondary | ICD-10-CM | POA: Diagnosis present

## 2014-07-24 DIAGNOSIS — Z87891 Personal history of nicotine dependence: Secondary | ICD-10-CM | POA: Diagnosis not present

## 2014-07-24 DIAGNOSIS — Z7401 Bed confinement status: Secondary | ICD-10-CM | POA: Diagnosis not present

## 2014-07-24 DIAGNOSIS — D61818 Other pancytopenia: Secondary | ICD-10-CM | POA: Diagnosis present

## 2014-07-24 DIAGNOSIS — I69998 Other sequelae following unspecified cerebrovascular disease: Secondary | ICD-10-CM | POA: Diagnosis not present

## 2014-07-24 DIAGNOSIS — D696 Thrombocytopenia, unspecified: Secondary | ICD-10-CM | POA: Diagnosis present

## 2014-07-24 DIAGNOSIS — I2510 Atherosclerotic heart disease of native coronary artery without angina pectoris: Secondary | ICD-10-CM | POA: Diagnosis present

## 2014-07-24 DIAGNOSIS — I252 Old myocardial infarction: Secondary | ICD-10-CM | POA: Diagnosis not present

## 2014-07-24 DIAGNOSIS — E538 Deficiency of other specified B group vitamins: Secondary | ICD-10-CM | POA: Diagnosis present

## 2014-07-24 DIAGNOSIS — R29898 Other symptoms and signs involving the musculoskeletal system: Secondary | ICD-10-CM | POA: Diagnosis present

## 2014-07-24 DIAGNOSIS — Z794 Long term (current) use of insulin: Secondary | ICD-10-CM | POA: Diagnosis not present

## 2014-07-24 DIAGNOSIS — R55 Syncope and collapse: Secondary | ICD-10-CM | POA: Diagnosis present

## 2014-07-24 DIAGNOSIS — Z681 Body mass index (BMI) 19 or less, adult: Secondary | ICD-10-CM | POA: Diagnosis not present

## 2014-07-24 DIAGNOSIS — I498 Other specified cardiac arrhythmias: Secondary | ICD-10-CM | POA: Diagnosis present

## 2014-07-24 DIAGNOSIS — D509 Iron deficiency anemia, unspecified: Secondary | ICD-10-CM | POA: Diagnosis present

## 2014-07-24 DIAGNOSIS — J90 Pleural effusion, not elsewhere classified: Secondary | ICD-10-CM | POA: Diagnosis present

## 2014-07-24 DIAGNOSIS — E785 Hyperlipidemia, unspecified: Secondary | ICD-10-CM | POA: Diagnosis present

## 2014-07-24 DIAGNOSIS — Z7982 Long term (current) use of aspirin: Secondary | ICD-10-CM | POA: Diagnosis not present

## 2014-07-24 DIAGNOSIS — I6529 Occlusion and stenosis of unspecified carotid artery: Secondary | ICD-10-CM | POA: Diagnosis present

## 2014-07-24 DIAGNOSIS — I10 Essential (primary) hypertension: Secondary | ICD-10-CM | POA: Diagnosis present

## 2014-07-24 DIAGNOSIS — S78119A Complete traumatic amputation at level between unspecified hip and knee, initial encounter: Secondary | ICD-10-CM | POA: Diagnosis not present

## 2014-07-24 DIAGNOSIS — Z7902 Long term (current) use of antithrombotics/antiplatelets: Secondary | ICD-10-CM | POA: Diagnosis not present

## 2014-07-24 DIAGNOSIS — M2550 Pain in unspecified joint: Secondary | ICD-10-CM | POA: Diagnosis present

## 2014-07-24 DIAGNOSIS — E43 Unspecified severe protein-calorie malnutrition: Secondary | ICD-10-CM | POA: Diagnosis present

## 2014-07-24 DIAGNOSIS — E119 Type 2 diabetes mellitus without complications: Secondary | ICD-10-CM | POA: Diagnosis present

## 2014-07-24 DIAGNOSIS — E860 Dehydration: Secondary | ICD-10-CM | POA: Diagnosis present

## 2014-07-24 LAB — CBC
HCT: 31.2 % — ABNORMAL LOW (ref 39.0–52.0)
HEMATOCRIT: 22.9 % — AB (ref 39.0–52.0)
HEMOGLOBIN: 10.5 g/dL — AB (ref 13.0–17.0)
Hemoglobin: 7.7 g/dL — ABNORMAL LOW (ref 13.0–17.0)
MCH: 29.8 pg (ref 26.0–34.0)
MCH: 29.8 pg (ref 26.0–34.0)
MCHC: 33.6 g/dL (ref 30.0–36.0)
MCHC: 33.7 g/dL (ref 30.0–36.0)
MCV: 88.8 fL (ref 78.0–100.0)
MCV: 88.6 fL (ref 78.0–100.0)
PLATELETS: 99 10*3/uL — AB (ref 150–400)
PLATELETS: 102 10*3/uL — AB (ref 150–400)
RBC: 2.58 MIL/uL — ABNORMAL LOW (ref 4.22–5.81)
RBC: 3.52 MIL/uL — ABNORMAL LOW (ref 4.22–5.81)
RDW: 16.9 % — AB (ref 11.5–15.5)
RDW: 16.3 % — ABNORMAL HIGH (ref 11.5–15.5)
WBC: 4.4 10*3/uL (ref 4.0–10.5)
WBC: 3.9 10*3/uL — ABNORMAL LOW (ref 4.0–10.5)

## 2014-07-24 LAB — BASIC METABOLIC PANEL
ANION GAP: 9 (ref 5–15)
BUN: 17 mg/dL (ref 6–23)
CALCIUM: 8 mg/dL — AB (ref 8.4–10.5)
CO2: 24 mEq/L (ref 19–32)
CREATININE: 0.69 mg/dL (ref 0.50–1.35)
Chloride: 100 mEq/L (ref 96–112)
GFR, EST NON AFRICAN AMERICAN: 89 mL/min — AB (ref >90)
Glucose, Bld: 104 mg/dL — ABNORMAL HIGH (ref 70–99)
Potassium: 4.3 mEq/L (ref 3.7–5.3)
Sodium: 133 mEq/L — ABNORMAL LOW (ref 137–147)

## 2014-07-24 LAB — GLUCOSE, CAPILLARY
GLUCOSE-CAPILLARY: 119 mg/dL — AB (ref 70–99)
GLUCOSE-CAPILLARY: 121 mg/dL — AB (ref 70–99)
GLUCOSE-CAPILLARY: 112 mg/dL — AB (ref 70–99)
Glucose-Capillary: 87 mg/dL (ref 70–99)
Glucose-Capillary: 113 mg/dL — ABNORMAL HIGH (ref 70–99)
Glucose-Capillary: 80 mg/dL (ref 70–99)
Glucose-Capillary: 92 mg/dL (ref 70–99)

## 2014-07-24 LAB — OCCULT BLOOD X 1 CARD TO LAB, STOOL: Fecal Occult Bld: NEGATIVE

## 2014-07-24 LAB — HAPTOGLOBIN: HAPTOGLOBIN: 119 mg/dL (ref 45–215)

## 2014-07-24 LAB — PREPARE RBC (CROSSMATCH)

## 2014-07-24 MED ORDER — BISACODYL 10 MG RE SUPP
10.0000 mg | Freq: Once | RECTAL | Status: AC
  Administered 2014-07-24: 10 mg via RECTAL
  Filled 2014-07-24: qty 1

## 2014-07-24 MED ORDER — CYANOCOBALAMIN 1000 MCG/ML IJ SOLN
1000.0000 ug | Freq: Once | INTRAMUSCULAR | Status: AC
Start: 2014-07-25 — End: 2014-07-25
  Administered 2014-07-25: 1000 ug via INTRAMUSCULAR
  Filled 2014-07-24: qty 1

## 2014-07-24 MED ORDER — SODIUM CHLORIDE 0.9 % IV SOLN
Freq: Once | INTRAVENOUS | Status: AC
  Administered 2014-07-24: 10:00:00 via INTRAVENOUS

## 2014-07-24 MED ORDER — CYANOCOBALAMIN 1000 MCG/ML IJ SOLN
1000.0000 ug | Freq: Once | INTRAMUSCULAR | Status: AC
  Administered 2014-07-24: 1000 ug via INTRAMUSCULAR
  Filled 2014-07-24: qty 1

## 2014-07-24 NOTE — Progress Notes (Signed)
Utilization review completed.  

## 2014-07-24 NOTE — Consult Note (Signed)
[facility] CONSULT NOTE  Patient Care Team: Dorrene German, MD as PCP - General (Internal Medicine)  CHIEF COMPLAINTS/PURPOSE OF CONSULTATION:  Severe anemia and mild thrombocytopenia  HISTORY OF PRESENTING ILLNESS:  [name] 79 y.o. African American male is admitted to the hospital because of syncope. He has extensive medical problems including diabetes, hypertension, CAD, multiple strokes, amputation for ischemia. He had a syncopal episode for 10 minutes and regained consciousness. Upon arrival to the hospital he had a CT of his head that did not show any acute findings. His chest x-ray showed moderate right pleural effusion but otherwise no abnormalities. His blood work revealed hemoglobin of 9 which had slowly declined down to 6.3. He got one unit of blood and his hemoglobin went up to 7.7 and he has no getting a second unit of blood transfusion. Workup did not reveal a clear-cut cause of the severe anemia. Hemolysis workup was negative. Iron studies showed anemia of chronic disease. However B12 levels are substantially low at 211. He also had mild thrombocytopenia his platelet counts at 99,000 today.  Previous history reveals that in 2011 he had severe anemia and he required blood transfusion at that time as well. But at that time he had amputation surgery done. His family do not report that he has had any blood loss in his stool.   I reviewed her records extensively and collaborated the history with the patient.  MEDICAL HISTORY:  Past Medical History  Diagnosis Date  . Diabetes mellitus   . Hypertension   . Myocardial infarction 2005  . Foot pain     while lying  . Blood clot in vein   . Blindness temporary   . Joint pain   . Loss of appetite   . Hyperlipidemia   . CAD (coronary artery disease)   . Stroke     resulting in left side weakness    SURGICAL HISTORY: Past Surgical History  Procedure Laterality Date  . Pr vein bypass graft,aorto-fem-pop     . Above knee leg amputation      right    SOCIAL HISTORY: History   Social History  . Marital Status: Widowed    Spouse Name: N/A    Number of Children: N/A  . Years of Education: N/A   Occupational History  . Not on file.   Social History Main Topics  . Smoking status: Former Smoker    Types: Cigarettes    Quit date: 11/14/2002  . Smokeless tobacco: Not on file  . Alcohol Use: No  . Drug Use:   . Sexual Activity:    Other Topics Concern  . Not on file   Social History Narrative  . No narrative on file    FAMILY HISTORY: History reviewed. No pertinent family history.  ALLERGIES:  has No Known Allergies.  MEDICATIONS:  Current Facility-Administered Medications  Medication Dose Route Frequency Provider Last Rate Last Dose  . acetaminophen (TYLENOL) tablet 650 mg  650 mg Oral Q6H PRN Carron Curie, MD   650 mg at 07/21/14 2211   Or  . acetaminophen (TYLENOL) suppository 650 mg  650 mg Rectal Q6H PRN Carron Curie, MD      . atorvastatin (LIPITOR) tablet 80 mg  80 mg Oral Daily Carron Curie, MD   80 mg at 07/24/14 1057  . clopidogrel (PLAVIX) tablet 75 mg  75 mg Oral Daily Carron Curie, MD   75 mg at 07/24/14 1057  . [START ON 07/25/2014] cyanocobalamin ((VITAMIN B-12))  injection 1,000 mcg  1,000 mcg Intramuscular Once Sabas Sous, MD      . cyanocobalamin ((VITAMIN B-12)) injection 1,000 mcg  1,000 mcg Intramuscular Once Sabas Sous, MD      . enoxaparin (LOVENOX) injection 40 mg  40 mg Subcutaneous Q24H Carron Curie, MD   40 mg at 07/23/14 2309  . feeding supplement (ENSURE COMPLETE) (ENSURE COMPLETE) liquid 237 mL  237 mL Oral BID BM Heather Cornelison Pitts, RD   237 mL at 07/24/14 1000  . HYDROcodone-acetaminophen (NORCO/VICODIN) 5-325 MG per tablet 1-2 tablet  1-2 tablet Oral Q4H PRN Carron Curie, MD   1 tablet at 07/22/14 0356  . insulin aspart (novoLOG) injection 0-5 Units  0-5 Units Subcutaneous QHS Carron Curie, MD      . insulin aspart (novoLOG) injection 0-9 Units   0-9 Units Subcutaneous TID WC Carron Curie, MD      . ondansetron [facility]) tablet 4 mg  4 mg Oral Q6H PRN Carron Curie, MD       Or  . ondansetron (ZOFRAN) injection 4 mg  4 mg Intravenous Q6H PRN Carron Curie, MD      . pioglitazone (ACTOS) tablet 45 mg  45 mg Oral Daily Carron Curie, MD   45 mg at 07/24/14 1057  . sodium chloride 0.9 % injection 3 mL  3 mL Intravenous Q12H Carron Curie, MD   3 mL at 07/24/14 1057  . zolpidem (AMBIEN) tablet 5 mg  5 mg Oral QHS PRN Carron Curie, MD        REVIEW OF SYSTEMS:   Constitutional: Patient is weak and frail Eyes: Positive for blurred vision Ears, nose, mouth, throat, and face: Dryness of mouth Respiratory: Shortness of breath requiring oxygen Cardiovascular: Denies palpitation, chest discomfort or lower extremity swelling Gastrointestinal:  Intermittent constipation Skin: Denies abnormal skin rashes Neurological: Able to understand and answer questions Behavioral/Psych: Mood is stable, no new changes  All other systems were reviewed with the patient and are negative.  PHYSICAL EXAMINATION: ECOG PERFORMANCE STATUS: 3 - Symptomatic, >50% confined to bed  Filed Vitals:   07/24/14 1435  BP: 138/37  Pulse: 62  Temp: 97.7 F (36.5 C)  Resp: 14   Filed Weights   07/20/14 2117 07/21/14 0900 07/23/14 0400  Weight: 103 lb 12.8 oz (47.083 kg) 103 lb (46.72 kg) 103 lb 9.9 oz (47 kg)    GENERAL: Bedbound SKIN: Dryness of skin EYES: palor OROPHARYNX: Dry mouth  NECK: supple, thyroid normal size, non-tender, without nodularity LUNGS: Diminished breath sounds at the lung bases HEART: regular rate & rhythm and no murmurs and no lower extremity edema ABDOMEN:abdomen soft, non-tender and normal bowel sounds Musculoskeletal: Previous amputation NEURO: no focal motor/sensory deficits  LABORATORY DATA:  I have reviewed the data as listed Lab Results  Component Value Date   WBC 4.4 07/24/2014   HGB 7.7* 07/24/2014   HCT 22.9* 07/24/2014   MCV 88.8  07/24/2014   PLT 99* 07/24/2014   Lab Results  Component Value Date   NA 133* 07/24/2014   K 4.3 07/24/2014   CL 100 07/24/2014   CO2 24 07/24/2014    RADIOGRAPHIC STUDIES: I have personally reviewed the radiological reports and agreed with the findings in the report.  ASSESSMENT AND PLAN:  1. Severe anemia: This is a normochromic normocytic anemia without any evidence of blood loss. On admission hemoglobin was 9 which had dropped 0.4 with IV fluid hydration. Hemolysis workup was negative. But he was found to have  severe B12 deficiency with B12 level of only 211. I will start him on B12 injections 1000 mcg intramuscularly today and tomorrow. Upon discharge home health needs to be involved so that they can give him monthly B12 injections at home.  2. Given his poor performance status, I would like to limit his this is the hospital but I would like to see him back in 3 months for a followup to recheck his blood levels and to decide if additional workup is necessary.  3. thrombocytopenia mild not clinically significant. It could also be related to B12 deficiency. This can be watched and monitored.  I suspect that his pancytopenia issues are related to either B12 deficiency on bone marrow suppression from unknown causes. Given his poor performance status I do not intend to perform a bone marrow biopsies. He should be treated conservatively. I discussed this with the patient's family and they're in agreement about.  All questions were answered.  Thank you very much for the consult   Sabas Sous, MD @ 3:11 PM

## 2014-07-24 NOTE — ED Provider Notes (Signed)
Medical screening examination/treatment/procedure(s) were conducted as a shared visit with non-physician practitioner(s) and myself.  I personally evaluated the patient during the encounter.   EKG Interpretation   Date/Time:  Sunday July 20 2014 15:34:03 EDT Ventricular Rate:  51 PR Interval:  197 QRS Duration: 144 QT Interval:  482 QTC Calculation: 444 R Axis:   -93 Text Interpretation:  Sinus rhythm Right bundle branch block Confirmed by  Desree Leap  MD, Rolando Whitby [phone]) on 07/20/2014 4:29:14 PM     Results for orders placed during the hospital encounter of 07/20/14  CULTURE, BLOOD (ROUTINE X 2)      Result Value Ref Range   Specimen Description BLOOD RIGHT HAND     Special Requests BOTTLES DRAWN AEROBIC ONLY 3CC     Culture  Setup Time       Value: 07/21/2014 00:26     Performed at [facility]   Culture       Value:        BLOOD CULTURE RECEIVED NO GROWTH TO DATE CULTURE WILL BE HELD FOR 5 DAYS BEFORE ISSUING A FINAL NEGATIVE REPORT     Performed at [facility]   Report Status PENDING    CULTURE, BLOOD (ROUTINE X 2)      Result Value Ref Range   Specimen Description BLOOD RIGHT HAND     Special Requests BOTTLES DRAWN AEROBIC AND ANAEROBIC 5 CC     Culture  Setup Time       Value: 07/21/2014 00:26     Performed at [facility]   Culture       Value:        BLOOD CULTURE RECEIVED NO GROWTH TO DATE CULTURE WILL BE HELD FOR 5 DAYS BEFORE ISSUING A FINAL NEGATIVE REPORT     Performed at [facility]   Report Status PENDING    CLOSTRIDIUM DIFFICILE BY PCR      Result Value Ref Range   C difficile by pcr NEGATIVE  NEGATIVE  STOOL CULTURE      Result Value Ref Range   Specimen Description STOOL     Special Requests NONE     Culture       Value: NO SUSPICIOUS COLONIES, CONTINUING TO HOLD     Performed at [facility]   Report Status PENDING    CBC WITH DIFFERENTIAL      Result Value Ref Range   WBC 5.0  4.0 - 10.5 K/uL   RBC 2.92  (*) 4.22 - 5.81 MIL/uL   Hemoglobin 9.0 (*) 13.0 - 17.0 g/dL   HCT 60.4 (*) 54.0 - 98.1 %   MCV 93.8  78.0 - 100.0 fL   MCH 30.8  26.0 - 34.0 pg   MCHC 32.8  30.0 - 36.0 g/dL   RDW 19.1 (*) 47.8 - 29.5 %   Platelets 128 (*) 150 - 400 K/uL   Neutrophils Relative % 77  43 - 77 %   Neutro Abs 3.9  1.7 - 7.7 K/uL   Lymphocytes Relative 15  12 - 46 %   Lymphs Abs 0.7  0.7 - 4.0 K/uL   Monocytes Relative 7  3 - 12 %   Monocytes Absolute 0.3  0.1 - 1.0 K/uL   Eosinophils Relative 1  0 - 5 %   Eosinophils Absolute 0.0  0.0 - 0.7 K/uL   Basophils Relative 0  0 - 1 %   Basophils Absolute 0.0  0.0 - 0.1 K/uL  BASIC  METABOLIC PANEL      Result Value Ref Range   Sodium 138  137 - 147 mEq/L   Potassium 4.4  3.7 - 5.3 mEq/L   Chloride 103  96 - 112 mEq/L   CO2 23  19 - 32 mEq/L   Glucose, Bld 159 (*) 70 - 99 mg/dL   BUN 17  6 - 23 mg/dL   Creatinine, Ser 1.61  0.50 - 1.35 mg/dL   Calcium 8.4  8.4 - 09.6 mg/dL   GFR calc non Af Amer 81 (*) >90 mL/min   GFR calc Af Amer >90  >90 mL/min   Anion gap 12  5 - 15  URINALYSIS, ROUTINE W REFLEX MICROSCOPIC      Result Value Ref Range   Color, Urine YELLOW  YELLOW   APPearance CLOUDY (*) CLEAR   Specific Gravity, Urine 1.020  1.005 - 1.030   pH 5.0  5.0 - 8.0   Glucose, UA NEGATIVE  NEGATIVE mg/dL   Hgb urine dipstick SMALL (*) NEGATIVE   Bilirubin Urine NEGATIVE  NEGATIVE   Ketones, ur NEGATIVE  NEGATIVE mg/dL   Protein, ur NEGATIVE  NEGATIVE mg/dL   Urobilinogen, UA 0.2  0.0 - 1.0 mg/dL   Nitrite NEGATIVE  NEGATIVE   Leukocytes, UA NEGATIVE  NEGATIVE  ETHANOL      Result Value Ref Range   Alcohol, Ethyl (B) <11  0 - 11 mg/dL  PROTIME-INR      Result Value Ref Range   Prothrombin Time 15.7 (*) 11.6 - 15.2 seconds   INR 1.25  0.00 - 1.49  APTT      Result Value Ref Range   aPTT 36  24 - 37 seconds  URINE RAPID DRUG SCREEN (HOSP PERFORMED)      Result Value Ref Range   Opiates NONE DETECTED  NONE DETECTED   Cocaine NONE DETECTED  NONE  DETECTED   Benzodiazepines NONE DETECTED  NONE DETECTED   Amphetamines NONE DETECTED  NONE DETECTED   Tetrahydrocannabinol NONE DETECTED  NONE DETECTED   Barbiturates NONE DETECTED  NONE DETECTED  URINE MICROSCOPIC-ADD ON      Result Value Ref Range   RBC / HPF 0-2  <3 RBC/hpf  BASIC METABOLIC PANEL      Result Value Ref Range   Sodium 140  137 - 147 mEq/L   Potassium 3.7  3.7 - 5.3 mEq/L   Chloride 107  96 - 112 mEq/L   CO2 25  19 - 32 mEq/L   Glucose, Bld 150 (*) 70 - 99 mg/dL   BUN 18  6 - 23 mg/dL   Creatinine, Ser 0.45  0.50 - 1.35 mg/dL   Calcium 7.7 (*) 8.4 - 10.5 mg/dL   GFR calc non Af Amer 80 (*) >90 mL/min   GFR calc Af Amer >90  >90 mL/min   Anion gap 8  5 - 15  CBC      Result Value Ref Range   WBC 3.7 (*) 4.0 - 10.5 K/uL   RBC 2.43 (*) 4.22 - 5.81 MIL/uL   Hemoglobin 7.4 (*) 13.0 - 17.0 g/dL   HCT 40.9 (*) 81.1 - 91.4 %   MCV 94.2  78.0 - 100.0 fL   MCH 30.5  26.0 - 34.0 pg   MCHC 32.3  30.0 - 36.0 g/dL   RDW 78.2 (*) 95.6 - 21.3 %   Platelets 98 (*) 150 - 400 K/uL  TROPONIN I      Result Value  Ref Range   Troponin I <0.30  <0.30 ng/mL  TROPONIN I      Result Value Ref Range   Troponin I <0.30  <0.30 ng/mL  TROPONIN I      Result Value Ref Range   Troponin I <0.30  <0.30 ng/mL  HEMOGLOBIN A1C      Result Value Ref Range   Hemoglobin A1C 6.1 (*) <5.7 %   Mean Plasma Glucose 128 (*) <117 mg/dL  GLUCOSE, CAPILLARY      Result Value Ref Range   Glucose-Capillary 115 (*) 70 - 99 mg/dL  GLUCOSE, CAPILLARY      Result Value Ref Range   Glucose-Capillary 66 (*) 70 - 99 mg/dL  GLUCOSE, CAPILLARY      Result Value Ref Range   Glucose-Capillary 88  70 - 99 mg/dL  HEMOGLOBIN Z6X      Result Value Ref Range   Hemoglobin A1C 6.1 (*) <5.7 %   Mean Plasma Glucose 128 (*) <117 mg/dL  GLUCOSE, CAPILLARY      Result Value Ref Range   Glucose-Capillary 82  70 - 99 mg/dL  GLUCOSE, CAPILLARY      Result Value Ref Range   Glucose-Capillary 86  70 - 99 mg/dL    Comment 1 Notify RN    GLUCOSE, CAPILLARY      Result Value Ref Range   Glucose-Capillary 124 (*) 70 - 99 mg/dL   Comment 1 Notify RN    GLUCOSE, CAPILLARY      Result Value Ref Range   Glucose-Capillary 101 (*) 70 - 99 mg/dL  LACTATE DEHYDROGENASE      Result Value Ref Range   LDH 186  94 - 250 U/L  HEPATIC FUNCTION PANEL      Result Value Ref Range   Total Protein 5.2 (*) 6.0 - 8.3 g/dL   Albumin 2.5 (*) 3.5 - 5.2 g/dL   AST 16  0 - 37 U/L   ALT 9  0 - 53 U/L   Alkaline Phosphatase 55  39 - 117 U/L   Total Bilirubin 0.3  0.3 - 1.2 mg/dL   Bilirubin, Direct <0.9  0.0 - 0.3 mg/dL   Indirect Bilirubin NOT CALCULATED  0.3 - 0.9 mg/dL  GLUCOSE, CAPILLARY      Result Value Ref Range   Glucose-Capillary 71  70 - 99 mg/dL  GLUCOSE, CAPILLARY      Result Value Ref Range   Glucose-Capillary 126 (*) 70 - 99 mg/dL  GLUCOSE, CAPILLARY      Result Value Ref Range   Glucose-Capillary 82  70 - 99 mg/dL  CBC      Result Value Ref Range   WBC 4.0  4.0 - 10.5 K/uL   RBC 2.12 (*) 4.22 - 5.81 MIL/uL   Hemoglobin 6.4 (*) 13.0 - 17.0 g/dL   HCT 60.4 (*) 54.0 - 98.1 %   MCV 92.9  78.0 - 100.0 fL   MCH 30.2  26.0 - 34.0 pg   MCHC 32.5  30.0 - 36.0 g/dL   RDW 19.1 (*) 47.8 - 29.5 %   Platelets 94 (*) 150 - 400 K/uL  BASIC METABOLIC PANEL      Result Value Ref Range   Sodium 137  137 - 147 mEq/L   Potassium 4.1  3.7 - 5.3 mEq/L   Chloride 104  96 - 112 mEq/L   CO2 22  19 - 32 mEq/L   Glucose, Bld 111 (*) 70 - 99 mg/dL  BUN 17  6 - 23 mg/dL   Creatinine, Ser 1.61  0.50 - 1.35 mg/dL   Calcium 7.8 (*) 8.4 - 10.5 mg/dL   GFR calc non Af Amer >90  >90 mL/min   GFR calc Af Amer >90  >90 mL/min   Anion gap 11  5 - 15  GLUCOSE, CAPILLARY      Result Value Ref Range   Glucose-Capillary 99  70 - 99 mg/dL  GLUCOSE, CAPILLARY      Result Value Ref Range   Glucose-Capillary 122 (*) 70 - 99 mg/dL  CBC      Result Value Ref Range   WBC 3.7 (*) 4.0 - 10.5 K/uL   RBC 2.12 (*) 4.22 - 5.81 MIL/uL    Hemoglobin 6.3 (*) 13.0 - 17.0 g/dL   HCT 09.6 (*) 04.5 - 40.9 %   MCV 92.9  78.0 - 100.0 fL   MCH 29.7  26.0 - 34.0 pg   MCHC 32.0  30.0 - 36.0 g/dL   RDW 81.1 (*) 91.4 - 78.2 %   Platelets 89 (*) 150 - 400 K/uL  VITAMIN B12      Result Value Ref Range   Vitamin B-12 213  211 - 911 pg/mL  FOLATE      Result Value Ref Range   Folate 11.8    IRON AND TIBC      Result Value Ref Range   Iron 52  42 - 135 ug/dL   TIBC 956 (*) 213 - 086 ug/dL   Saturation Ratios 29  20 - 55 %   UIBC 129  125 - 400 ug/dL  FERRITIN      Result Value Ref Range   Ferritin 73  22 - 322 ng/mL  RETICULOCYTES      Result Value Ref Range   Retic Ct Pct 1.5  0.4 - 3.1 %   RBC. 2.12 (*) 4.22 - 5.81 MIL/uL   Retic Count, Manual 31.8  19.0 - 186.0 K/uL  LACTATE DEHYDROGENASE      Result Value Ref Range   LDH 203  94 - 250 U/L  HAPTOGLOBIN      Result Value Ref Range   Haptoglobin 119  45 - 215 mg/dL  PROTIME-INR      Result Value Ref Range   Prothrombin Time 15.1  11.6 - 15.2 seconds   INR 1.19  0.00 - 1.49  I-STAT TROPOININ, ED      Result Value Ref Range   Troponin i, poc 0.00  0.00 - 0.08 ng/mL   Comment 3           I-STAT CG4 LACTIC ACID, ED      Result Value Ref Range   Lactic Acid, Venous 1.79  0.5 - 2.2 mmol/L  POC OCCULT BLOOD, ED      Result Value Ref Range   Fecal Occult Bld NEGATIVE  NEGATIVE  I-STAT VENOUS BLOOD GAS, ED      Result Value Ref Range   pH, Ven 7.383 (*) 7.250 - 7.300   pCO2, Ven 41.9 (*) 45.0 - 50.0 mmHg   pO2, Ven 24.0 (*) 30.0 - 45.0 mmHg   Bicarbonate 25.0 (*) 20.0 - 24.0 mEq/L   TCO2 26  0 - 100 mmol/L   O2 Saturation 42.0     Patient temperature 37.0 C     Collection site ARTERIAL LINE     Drawn by Operator     Sample type VENOUS     Comment NOTIFIED  PHYSICIAN    PREPARE RBC (CROSSMATCH)      Result Value Ref Range   Order Confirmation ORDER PROCESSED BY BLOOD BANK    TYPE AND SCREEN      Result Value Ref Range   ABO/RH(D) O POS     Antibody Screen NEG      Sample Expiration 07/26/2014     Unit Number [id number]     Blood Component Type RED CELLS,LR     Unit division 00     Status of Unit ISSUED,FINAL     Transfusion Status OK TO TRANSFUSE     Crossmatch Result Compatible     Dg Chest 2 View  07/20/2014   CLINICAL DATA:  Syncope  EXAM: CHEST  2 VIEW  COMPARISON:  Multiple exams, including 05/13/2010  FINDINGS: The peripheral lucency over the left hemithorax is probably due to a large wrinkling of the skin although its position makes it difficult to see lung markings extending peripheral to it. While I doubt that this is a pneumothorax, further imaging may be required if the patient is breath sounds are distant on the left.  Moderate right pleural effusion. Suspected emphysema. Chronic right seventh rib deformity. Atherosclerotic vascular calcifications. Heart size within normal limits.  IMPRESSION: 1. Moderate right pleural effusion. 2. I suspect that the lucency over the left lateral chest is due to a skin fold rather than pneumothorax ; if left-sided chest sounds are distant than further imaging workup may be warranted. 3. Atherosclerotic aortic arch. 4. Suspected emphysema. 5. Chronic right seventh rib deformity posterolaterally.   Electronically Signed   By: Herbie Baltimore M.D.   On: 07/20/2014 18:02   Ct Head Wo Contrast  07/20/2014   CLINICAL DATA:  Loss of consciousness.  EXAM: CT HEAD WITHOUT CONTRAST  TECHNIQUE: Contiguous axial images were obtained from the base of the skull through the vertex without intravenous contrast.  COMPARISON:  10/27/2008  FINDINGS: Ventricles and cisterns are within normal. There is mild age related atrophic change. There is a moderate size old right MCA territory infarct as well as old infarcts over the left cerebellum and left occipital region. There is mild chronic ischemic microvascular disease. There is no mass, mass effect, shift of midline structures or acute hemorrhage. There is no evidence to suggest  acute infarction. Remainder the exam is unchanged.  IMPRESSION: No acute intracranial findings.  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   Electronically Signed   By: Elberta Fortis M.D.   On: 07/20/2014 16:51   Ct Angio Neck W/cm &/or Wo/cm  07/24/2014   CLINICAL DATA:  Evaluate occlusion seen on prior imaging.  EXAM: CT ANGIOGRAPHY NECK  TECHNIQUE: Multidetector CT imaging of the neck was performed using the standard protocol during bolus administration of intravenous contrast. Multiplanar CT image reconstructions and MIPs were obtained to evaluate the vascular anatomy. Carotid stenosis measurements (when applicable) are obtained utilizing NASCET criteria, using the distal internal carotid diameter as the denominator.  CONTRAST:  50mL OMNIPAQUE IOHEXOL 350 MG/ML SOLN  COMPARISON:  MRI of the head July 22, 2014  FINDINGS: Severe calcific atherosclerosis of the imaged aortic arch. Approximately 70% narrowing of the origin of the left subclavian artery due to calcific atherosclerosis. The origin of the left Common carotid artery and innominate artery are not imaged though there is at least moderate to severe calcific atherosclerosis. Complete occlusion of the right internal carotid artery approximately 21 mm from the origin with central low density and extreme  calcific atherosclerosis ; the vessel remains occluded including the visualized right internal carotid artery. The left Common carotid artery is normal in course and, though at least mild to moderate stenosis due to severe atherosclerosis.  Severe calcific atherosclerosis of the bilateral carotid bulbs, without hemodynamically significant stenosis of the left internal carotid artery by NASCET criteria. Left cervical internal carotid artery is patent. Occluded right internal carotid artery.  Calcific atherosclerosis results in high-grade stenosis of the origin the bilateral vertebral arteries, with poor contrast  opacification. Contrast opacification of the vertebral arteries immediately prior to the foraminal segment. The distal intra foraminal segments are widely patent, as are the extra foraminal extra dural segment and included intradural segment.  Cervical spondylosis with C3 through C5 posterior cerclage wires.  At least small to moderate layering left pleural effusion. Centrilobular emphysema partially imaged.  Review of the MIP images confirms the above findings.  IMPRESSION: Severe calcific atherosclerosis, resulting in high-grade stenosis the origin of the left subclavian artery.  Complete chronic appearing occlusion of the right Common carotid artery approximately 21 mm from the origin. Occluded visualized right internal carotid artery.  High-grade stenosis origin of bilateral vertebral arteries, which remain patent.  Partially imaged small to moderate layering left pleural effusion.   Electronically Signed   By: Awilda Metro   On: 07/24/2014 00:47   Mr Brain Wo Contrast  07/22/2014   CLINICAL DATA:  79 year old male with syncope, altered mental status, not responsive. Initial encounter. Recent diarrhea. History of diabetes, vascular disease including lower extremity amputations. History of right hemisphere infarcts, right ICA stenosis status post remote right ICA angioplasty.  EXAM: MRI HEAD WITHOUT CONTRAST  TECHNIQUE: Multiplanar, multiecho pulse sequences of the brain and surrounding structures were obtained without intravenous contrast.  COMPARISON:  Head CTs without contrast 07/30/2014 and earlier. Brain MRI 10/27/2008. Cerebral angiogram 07/22/2009.  FINDINGS: Progressive encephalomalacia since 2009 at which time acute on chronic right MCA ischemia was demonstrated. 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  No restricted diffusion or evidence of acute infarction. The right ICA flow void is absent, was present in 2009. Other Major  intracranial vascular flow voids are stable.  No ventriculomegaly. No acute intracranial hemorrhage identified. No midline shift, mass effect, or evidence of intracranial mass lesion. No cerebral edema identified. Stable deep gray matter nuclei, brainstem, pituitary, and cervicomedullary junction. Postoperative changes to the posterior cervical spine with mild susceptibility artifact. Normal bone marrow signal.  Grossly stable orbits soft tissues. Occasional small paranasal sinus retention cysts. No acute scalp soft tissue findings.  IMPRESSION: 1. Right ICA occlusion, new since 2010 but suspected to be chronic in light of no acute signal changes in the brain. 2. Progressed right MCA ischemia/encephalomalacia since 2009, but no acute intracranial abnormality. 3. 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   Electronically Signed   By: Augusto Gamble M.D.   On: 07/22/2014 14:11   [place] Chest  07/23/2014   CLINICAL DATA:  Evaluate for right pleural effusion and possible thoracentesis.  EXAM: CHEST ULTRASOUND  COMPARISON:  Chest radiograph 07/20/2014  FINDINGS: Ultrasound of the right chest was obtained. There is no significant right pleural fluid.  IMPRESSION: No significant right pleural effusion.  Thoracentesis not performed.   Electronically Signed   By: Richarda Overlie M.D.   On: 07/23/2014 17:24    Patient seen. Patient brought in by EMS. Patient was sitting at table and slumped over [place] to family of was unresponsive  for approximately 10 minutes. Patient had been suffering some episodes of diarrhea yesterday. That seems to have cleared. Patient upon arrival to the emergency department was baseline according to family. Patient was alert and cooperative. Denied any complaints at all. Workup without any significant findings. Patient clinically could be some dehydration based on the history of the diarrhea this may have contributed to the syncopal event. Either way patient requires admission and monitoring due  to the syncopal event.  Vanetta Mulders, MD 07/24/14 [phone]

## 2014-07-24 NOTE — Progress Notes (Addendum)
[facility] Progress Note   [name] ZOX:[id number] DOB: 08/08/1935 DOA: 07/20/2014 PCP: Dorrene German, MD  Brief narrative: [name] is a 79 y.o. male with past medical history significant for diabetes mellitus hypertension and coronary artery disease in addition to multiple strokes in the past presenting with a syncopal episode noted by his daughter. The patient slumped to the right and the family could not wake him up. It lasted for 5-10 minutes, no seizure activity noted, no complaints of chest pains or palpitations. The patient is bedbound and stays at home with his daughter who takes care of all activities of daily living.  Subjective: No complaints.   Assessment/Plan: Principal Problem:   Syncope- severe PVD -  work up has including the following - cardiac markers which were negative -  MRI which does not reveal any acute abnormalities - ECHO - EF 55-60 % and grade 1 diastolic dysfunction - Carotid dopplers- reveals a probably chronic occlusion of the right ICA-  - CTA reveals extensive stenosis including left subclavian, b/l vertebral arteries and a chronic comple occlusion of right common carotid artery- this was likely the cause of his syncope along with underlying anemia - have discussed above finding with Neurology- advised to add ASA and cont Statin and Plavix however, as I am currently working up his anemia and awaiting a stool occult, I will hold off on starting further anticoagulation for now  Active Problems: Anemia - Hgb dropping from 9.0 to 6.4 with no overt signs of bleeding - transfused 1 U PRBC 9/9 - Hgb 7.7 today- will give another unit of blood -  LDH, haptoglobin- normal - stool hemoccult pending - anemia panel reveals AOCD - Vit B12 and folic acid normal - will consult hematology  Thrombocytopenia - acute as well-  PT/PTT normal - cont Plavix for now and monitor for bleeding  PVD with b/l BKA - cont Plavix    Type II or unspecified  type diabetes mellitus without mention of complication, not stated as uncontrolled - A1c 6.1 - cont sliding scale insulin and Actos    CVA Cont Plavix- no acute event noted    Pleural effusion, right - acute? Sent for thoracentesis but not enough fluid to drain    Essential hypertension, benign -  Metoprolol has been on hold- BP not elevated- will cont to hold  Code Status: Full code Family Communication: with daughter Disposition Plan: home with daughter DVT prophylaxis: SCDs  Consultants: none  Procedures: none  Antibiotics: Anti-infectives   None      Objective: Filed Weights   07/20/14 2117 07/21/14 0900 07/23/14 0400  Weight: 47.083 kg (103 lb 12.8 oz) 46.72 kg (103 lb) 47 kg (103 lb 9.9 oz)    Intake/Output Summary (Last 24 hours) at 07/24/14 0906 Last data filed at 07/24/14 0847  Gross per 24 hour  Intake 1067.5 ml  Output   1025 ml  Net   42.5 ml     Vitals Filed Vitals:   07/23/14 1945 07/23/14 2045 07/23/14 2259 07/24/14 0500  BP: 122/37 118/38 118/62 105/33  Pulse: 78 78 66 104  Temp: 98.1 F (36.7 C) 98.1 F (36.7 C)  98.2 F (36.8 C)  TempSrc: Oral Oral    Resp: Height:      Weight:      SpO2: 100%  99% 94%    Exam: General: No acute respiratory distress Lungs: Clear to auscultation bilaterally without wheezes or crackles Cardiovascular: Regular rate and rhythm  without murmur gallop or rub normal S1 and S2 Abdomen: Nontender, nondistended, soft, bowel sounds positive, no rebound, no ascites, no appreciable mass Extremities: b/l BKA  Data Reviewed: Basic Metabolic Panel:  Recent Labs Lab 07/20/14 1545 07/21/14 0320 07/23/14 1137  NA 138 140 137  K 4.4 3.7 4.1  CL 103 107 104  CO2 GLUCOSE 159* 150* 111*  BUN CREATININE 0.87 0.90 0.66  CALCIUM 8.4 7.7* 7.8*   Liver Function Tests:  Recent Labs Lab 07/22/14 1350  AST 16  ALT 9  ALKPHOS 55  BILITOT 0.3  PROT 5.2*  ALBUMIN 2.5*   No  results found for this basename: LIPASE, AMYLASE,  in the last 168 hours No results found for this basename: AMMONIA,  in the last 168 hours CBC:  Recent Labs Lab 07/20/14 1545 07/21/14 0320 07/23/14 1137 07/23/14 1308  WBC 5.0 3.7* 4.0 3.7*  NEUTROABS 3.9  --   --   --   HGB 9.0* 7.4* 6.4* 6.3*  HCT 27.4* 22.9* 19.7* 19.7*  MCV 93.8 94.2 92.9 92.9  PLT 128* 98* 94* 89*   Cardiac Enzymes:  Recent Labs Lab 07/20/14 2158 07/21/14 0320 07/21/14 1000  TROPONINI <0.30 <0.30 <0.30   BNP (last 3 results) No results found for this basename: PROBNP,  in the last 8760 hours CBG:  Recent Labs Lab 07/23/14 0721 07/23/14 1155 07/23/14 1225 07/23/14 1808 07/24/14 0736  GLUCAP 82 99 122* 113* 80    Recent Results (from the past 240 hour(s))  CULTURE, BLOOD (ROUTINE X 2)     Status: None   Collection Time    07/20/14  5:27 PM      Result Value Ref Range Status   Specimen Description BLOOD RIGHT HAND   Final   Special Requests BOTTLES DRAWN AEROBIC AND ANAEROBIC 5 CC   Final   Culture  Setup Time     Final   Value: 07/21/2014 00:26     Performed at [facility]   Culture     Final   Value:        BLOOD CULTURE RECEIVED NO GROWTH TO DATE CULTURE WILL BE HELD FOR 5 DAYS BEFORE ISSUING A FINAL NEGATIVE REPORT     Performed at [facility]   Report Status PENDING   Incomplete  CULTURE, BLOOD (ROUTINE X 2)     Status: None   Collection Time    07/20/14  6:13 PM      Result Value Ref Range Status   Specimen Description BLOOD RIGHT HAND   Final   Special Requests BOTTLES DRAWN AEROBIC ONLY 3CC   Final   Culture  Setup Time     Final   Value: 07/21/2014 00:26     Performed at [facility]   Culture     Final   Value:        BLOOD CULTURE RECEIVED NO GROWTH TO DATE CULTURE WILL BE HELD FOR 5 DAYS BEFORE ISSUING A FINAL NEGATIVE REPORT     Performed at [facility]   Report Status PENDING   Incomplete  CLOSTRIDIUM DIFFICILE BY PCR     Status:  None   Collection Time    07/22/14  3:51 AM      Result Value Ref Range Status   C difficile by pcr NEGATIVE  NEGATIVE Final  STOOL CULTURE     Status: None   Collection Time    07/22/14  3:51  AM      Result Value Ref Range Status   Specimen Description STOOL   Final   Special Requests NONE   Final   Culture     Final   Value: NO SUSPICIOUS COLONIES, CONTINUING TO HOLD     Performed at [facility]   Report Status PENDING   Incomplete     Studies:  Recent x-ray studies have been reviewed in detail by the Attending Physician  Scheduled Meds:  Scheduled Meds: . atorvastatin  80 mg Oral Daily  . bisacodyl  10 mg Rectal Once  . clopidogrel  75 mg Oral Daily  . enoxaparin (LOVENOX) injection  40 mg Subcutaneous Q24H  . feeding supplement (ENSURE COMPLETE)  237 mL Oral BID BM  . insulin aspart  0-5 Units Subcutaneous QHS  . insulin aspart  0-9 Units Subcutaneous TID WC  . pioglitazone  45 mg Oral Daily  . sodium chloride  3 mL Intravenous Q12H   Continuous Infusions:    Time spent on care of this patient: 30 min   Eamonn Sermeno, MD 07/24/2014, 9:06 AM  LOS: 4 days   [facility] Office  [phone] Pager - Text Page per [email or web address]  If 7PM-7AM, please contact night-coverage [email or web address]

## 2014-07-25 ENCOUNTER — Other Ambulatory Visit: Payer: Self-pay | Admitting: *Deleted

## 2014-07-25 DIAGNOSIS — D518 Other vitamin B12 deficiency anemias: Secondary | ICD-10-CM

## 2014-07-25 DIAGNOSIS — D519 Vitamin B12 deficiency anemia, unspecified: Secondary | ICD-10-CM | POA: Diagnosis present

## 2014-07-25 DIAGNOSIS — D62 Acute posthemorrhagic anemia: Secondary | ICD-10-CM

## 2014-07-25 LAB — TYPE AND SCREEN
ABO/RH(D): O POS
Antibody Screen: NEGATIVE
UNIT DIVISION: 0
Unit division: 0

## 2014-07-25 LAB — GLUCOSE, CAPILLARY
GLUCOSE-CAPILLARY: 99 mg/dL (ref 70–99)
Glucose-Capillary: 105 mg/dL — ABNORMAL HIGH (ref 70–99)

## 2014-07-25 LAB — STOOL CULTURE

## 2014-07-25 LAB — PATHOLOGIST SMEAR REVIEW

## 2014-07-25 MED ORDER — CYANOCOBALAMIN 1000 MCG/ML IJ SOLN: 1000.0000 ug | INTRAMUSCULAR | Status: DC

## 2014-07-25 MED ORDER — ASPIRIN EC 81 MG PO TBEC: 81.0000 mg | DELAYED_RELEASE_TABLET | Freq: Every day | ORAL | Status: DC

## 2014-07-25 NOTE — Care Management Note (Addendum)
    Page 1 of 1   07/25/2014     3:45:01 PM CARE MANAGEMENT NOTE 07/25/2014  Patient:  [name], [name]   Account Number:  [id number]  Date Initiated:  07/25/2014  Documentation initiated by:  GRAVES-BIGELOW,Malika Demario  Subjective/Objective Assessment:   Pt admitted for syncope. Bilateral Amputee. Plan for d/c home today with daughter.     Action/Plan:   Daughter to provide transportation. Pt has motorized wheelchair. [facility] was given referral for [facility] for vit b 12 injections and CSW for additional resources possible SNF if needs from home.   Anticipated DC Date:  07/25/2014   Anticipated DC Plan:  HOME W HOME HEALTH SERVICES      DC Planning Services  CM consult      [facility] Choice  HOME HEALTH   Choice offered to / List presented to:  C-4 Adult Children        HH arranged  HH-1 RN  HH-10 DISEASE MANAGEMENT  HH-6 SOCIAL WORKER      HH agency  [facility]   Status of service:  Completed, signed off Medicare Important Message given?  YES (If response is "NO", the following Medicare IM given date fields will be blank) Date Medicare IM given:  07/25/2014 Medicare IM given by:  GRAVES-BIGELOW,Sarahi Borland Date Additional Medicare IM given:   Additional Medicare IM given by:    Discharge Disposition:  HOME W HOME HEALTH SERVICES  Per UR Regulation:  Reviewed for med. necessity/level of care/duration of stay  If discussed at Long Length of Stay Meetings, dates discussed:    Comments:  07-25-14 1540 CM did call the daughter [name] to call [facility] for Hospital F/u. No further needs from CM.

## 2014-07-25 NOTE — Discharge Summary (Addendum)
Physician Discharge Summary  [name] MVH:[id number] DOB: 05/12/1935 DOA: 07/20/2014  PCP: Dorrene German, MD  Admit date: 07/20/2014 Discharge date: 07/25/2014  Time spent: >45 minutes  Recommendations for Outpatient Follow-up:  1. F/u on B 12 levels and CBC in 1 month 2. F/u on right sided pleural effusion  Discharge Condition: stable Diet recommendation: heart healthy  Discharge Diagnoses:  Principal Problem:   Syncope- high grade intracranial stenosis Active Problems:   B12 deficiency anemia   Type II or unspecified type diabetes mellitus without mention of complication, not stated as uncontrolled   CVA   PVD (peripheral vascular disease)   Pleural effusion, right   Essential hypertension, benign   Protein-calorie malnutrition, severe   Thrombocytopenia, unspecified    History of present illness:  [name] is a 79 y.o. male with past medical history significant for diabetes mellitus hypertension and coronary artery disease in addition to multiple strokes in the past presenting with a syncopal episode noted by his daughter. The patient slumped to the right and the family could not wake him up. It lasted for 5-10 minutes, no seizure activity noted, no complaints of chest pains or palpitations. The patient is bedbound and stays at home with his daughter who takes care of all activities of daily living.   Hospital Course:  Principal Problem:  Syncope- severe PVD  - work up has including the following  - cardiac markers which were negative  - MRI which does not reveal any acute abnormalities  - ECHO - EF 55-60 % and grade 1 diastolic dysfunction  - Carotid dopplers- reveals a probably chronic occlusion of the right ICA-  - CTA reveals extensive stenosis including left subclavian, b/l vertebral arteries and a chronic comple occlusion of right common carotid artery- this was likely the cause of his syncope along with the underlying anemia  - have discussed above finding  with Neurology- advised to add ASA and cont Statin and Plavix    Active Problems:  Anemia  - Hgb dropping from 9.0 to 6.4 with hydration with no overt signs of bleeding  - transfused 1 U PRBC 9/9 bringing up toHgb 7.7- given another unit of blood with increase in hgb to 10.5 - LDH, haptoglobin- normal  - stool hemoccult negative - anemia panel reveals AOCD -- folic acid normal  - Vit B12 low normal and likely the cause of his anemia- consulted hematology who advised 2 consecutive days of B12 (yesterday and today) and then monthly injections.   Thrombocytopenia  - acute as well and also due to B12 deficiency  - PT/PTT normal  - cont Plavix for now   PVD with b/l BKA  - cont Plavix - adding ASA as mentioned above  Type II or unspecified type diabetes mellitus without mention of complication, not stated as uncontrolled  - A1c 6.1  - cont sliding scale insulin and Actos   CVA  Cont Plavix- no acute event noted   Pleural effusion, right  - acute? Sent for thoracentesis but not enough fluid to drain   Essential hypertension, benign  - Metoprolol being held due to low BP- will d/c for now- needs repeat BP as outpt prior to resuming  Consultations:  Hematology  Discharge Exam: Filed Weights   07/21/14 0900 07/23/14 0400 07/25/14 0000  Weight: 46.72 kg (103 lb) 47 kg (103 lb 9.9 oz) 46.877 kg (103 lb 5.5 oz)   Filed Vitals:   07/25/14 0400  BP: 110/41  Pulse: 68  Temp: 98.9 F (  37.2 C)  Resp: 18    General: AAO x 3, no distress Cardiovascular: RRR, no murmurs  Respiratory: clear to auscultation bilaterally [id number], non-tender, non-distended, bowel sound positive  Discharge Instructions You were cared for by a hospitalist during your hospital stay. If you have any questions about your discharge medications or the care you received while you were in the hospital after you are discharged, you can call the unit and asked to speak with the hospitalist on call if the  hospitalist that took care of you is not available. Once you are discharged, your primary care physician will handle any further medical issues. Please note that NO REFILLS for any discharge medications will be authorized once you are discharged, as it is imperative that you return to your primary care physician (or establish a relationship with a primary care physician if you do not have one) for your aftercare needs so that they can reassess your need for medications and monitor your lab values.      Discharge Instructions   Diet - low sodium heart healthy    Complete by:  As directed   Low cholesterol     Face-to-face encounter (required for Medicare/Medicaid patients)    Complete by:  As directed   The encounter with the patient was in whole, or in part, for the following medical condition, which is the primary reason for home health care:  b12 deficiency anemia  I certify that, based on my findings, the following services are medically necessary home health services:  Nursing  My clinical findings support the need for the above services:  OTHER SEE COMMENTS  Further, I certify that my clinical findings support that this patient is homebound due to:  Unable to leave home safely without assistance  Reason for Medically Necessary Home Health Services:  Skilled Nursing- Administration and Training of Injectable Medication     Home Health    Complete by:  As directed   To provide the following care/treatments:  RN     Increase activity slowly    Complete by:  As directed             Medication List    STOP taking these medications       metoprolol succinate 25 MG 24 hr tablet  Commonly known as:  TOPROL-XL      TAKE these medications       atorvastatin 80 MG tablet  Commonly known as:  LIPITOR  Take 80 mg by mouth daily.     clopidogrel 75 MG tablet  Commonly known as:  PLAVIX  Take 75 mg by mouth daily.     cyanocobalamin 1000 MCG/ML injection  Commonly known as:  (VITAMIN  B-12)  Inject 1 mL (1,000 mcg total) into the muscle every 30 (thirty) days.     pioglitazone 45 MG tablet  Commonly known as:  ACTOS  Take 45 mg by mouth daily.     traMADol 50 MG tablet  Commonly known as:  ULTRAM  Take 50 mg by mouth every 6 (six) hours as needed.       No Known Allergies    The results of significant diagnostics from this hospitalization (including imaging, microbiology, ancillary and laboratory) are listed below for reference.    Significant Diagnostic Studies: Dg Chest 2 View  07/20/2014   CLINICAL DATA:  Syncope  EXAM: CHEST  2 VIEW  COMPARISON:  Multiple exams, including 05/13/2010  FINDINGS: The peripheral lucency over the left hemithorax is probably  due to a large wrinkling of the skin although its position makes it difficult to see lung markings extending peripheral to it. While I doubt that this is a pneumothorax, further imaging may be required if the patient is breath sounds are distant on the left.  Moderate right pleural effusion. Suspected emphysema. Chronic right seventh rib deformity. Atherosclerotic vascular calcifications. Heart size within normal limits.  IMPRESSION: 1. Moderate right pleural effusion. 2. I suspect that the lucency over the left lateral chest is due to a skin fold rather than pneumothorax ; if left-sided chest sounds are distant than further imaging workup may be warranted. 3. Atherosclerotic aortic arch. 4. Suspected emphysema. 5. Chronic right seventh rib deformity posterolaterally.   Electronically Signed   By: Herbie Baltimore M.D.   On: 07/20/2014 18:02   Ct Head Wo Contrast  07/20/2014   CLINICAL DATA:  Loss of consciousness.  EXAM: CT HEAD WITHOUT CONTRAST  TECHNIQUE: Contiguous axial images were obtained from the base of the skull through the vertex without intravenous contrast.  COMPARISON:  10/27/2008  FINDINGS: Ventricles and cisterns are within normal. There is mild age related atrophic change. There is a moderate size old  right MCA territory infarct as well as old infarcts over the left cerebellum and left occipital region. There is mild chronic ischemic microvascular disease. There is no mass, mass effect, shift of midline structures or acute hemorrhage. There is no evidence to suggest acute infarction. Remainder the exam is unchanged.  IMPRESSION: No acute intracranial findings.  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   Electronically Signed   By: Elberta Fortis M.D.   On: 07/20/2014 16:51   Ct Angio Neck W/cm &/or Wo/cm  07/24/2014   CLINICAL DATA:  Evaluate occlusion seen on prior imaging.  EXAM: CT ANGIOGRAPHY NECK  TECHNIQUE: Multidetector CT imaging of the neck was performed using the standard protocol during bolus administration of intravenous contrast. Multiplanar CT image reconstructions and MIPs were obtained to evaluate the vascular anatomy. Carotid stenosis measurements (when applicable) are obtained utilizing NASCET criteria, using the distal internal carotid diameter as the denominator.  CONTRAST:  50mL OMNIPAQUE IOHEXOL 350 MG/ML SOLN  COMPARISON:  MRI of the head July 22, 2014  FINDINGS: Severe calcific atherosclerosis of the imaged aortic arch. Approximately 70% narrowing of the origin of the left subclavian artery due to calcific atherosclerosis. The origin of the left Common carotid artery and innominate artery are not imaged though there is at least moderate to severe calcific atherosclerosis. Complete occlusion of the right internal carotid artery approximately 21 mm from the origin with central low density and extreme calcific atherosclerosis ; the vessel remains occluded including the visualized right internal carotid artery. The left Common carotid artery is normal in course and, though at least mild to moderate stenosis due to severe atherosclerosis.  Severe calcific atherosclerosis of the bilateral carotid bulbs, without hemodynamically significant stenosis of  the left internal carotid artery by NASCET criteria. Left cervical internal carotid artery is patent. Occluded right internal carotid artery.  Calcific atherosclerosis results in high-grade stenosis of the origin the bilateral vertebral arteries, with poor contrast opacification. Contrast opacification of the vertebral arteries immediately prior to the foraminal segment. The distal intra foraminal segments are widely patent, as are the extra foraminal extra dural segment and included intradural segment.  Cervical spondylosis with C3 through C5 posterior cerclage wires.  At least small to moderate layering left pleural effusion. Centrilobular emphysema partially imaged.  Review of  the MIP images confirms the above findings.  IMPRESSION: Severe calcific atherosclerosis, resulting in high-grade stenosis the origin of the left subclavian artery.  Complete chronic appearing occlusion of the right Common carotid artery approximately 21 mm from the origin. Occluded visualized right internal carotid artery.  High-grade stenosis origin of bilateral vertebral arteries, which remain patent.  Partially imaged small to moderate layering left pleural effusion.   Electronically Signed   By: Awilda Metro   On: 07/24/2014 00:47   Mr Brain Wo Contrast  07/22/2014   CLINICAL DATA:  79 year old male with syncope, altered mental status, not responsive. Initial encounter. Recent diarrhea. History of diabetes, vascular disease including lower extremity amputations. History of right hemisphere infarcts, right ICA stenosis status post remote right ICA angioplasty.  EXAM: MRI HEAD WITHOUT CONTRAST  TECHNIQUE: Multiplanar, multiecho pulse sequences of the brain and surrounding structures were obtained without intravenous contrast.  COMPARISON:  Head CTs without contrast 07/30/2014 and earlier. Brain MRI 10/27/2008. Cerebral angiogram 07/22/2009.  FINDINGS: Progressive encephalomalacia since 2009 at which time acute on chronic right MCA  ischemia was demonstrated. 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  No restricted diffusion or evidence of acute infarction. The right ICA flow void is absent, was present in 2009. Other Major intracranial vascular flow voids are stable.  No ventriculomegaly. No acute intracranial hemorrhage identified. No midline shift, mass effect, or evidence of intracranial mass lesion. No cerebral edema identified. Stable deep gray matter nuclei, brainstem, pituitary, and cervicomedullary junction. Postoperative changes to the posterior cervical spine with mild susceptibility artifact. Normal bone marrow signal.  Grossly stable orbits soft tissues. Occasional small paranasal sinus retention cysts. No acute scalp soft tissue findings.  IMPRESSION: 1. Right ICA occlusion, new since 2010 but suspected to be chronic in light of no acute signal changes in the brain. 2. Progressed right MCA ischemia/encephalomalacia since 2009, but no acute intracranial abnormality. 3. 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   Electronically Signed   By: Augusto Gamble M.D.   On: 07/22/2014 14:11   [place] Chest  07/23/2014   CLINICAL DATA:  Evaluate for right pleural effusion and possible thoracentesis.  EXAM: CHEST ULTRASOUND  COMPARISON:  Chest radiograph 07/20/2014  FINDINGS: Ultrasound of the right chest was obtained. There is no significant right pleural fluid.  IMPRESSION: No significant right pleural effusion.  Thoracentesis not performed.   Electronically Signed   By: Richarda Overlie M.D.   On: 07/23/2014 17:24    Microbiology: Recent Results (from the past 240 hour(s))  CULTURE, BLOOD (ROUTINE X 2)     Status: None   Collection Time    07/20/14  5:27 PM      Result Value Ref Range Status   Specimen Description BLOOD RIGHT HAND   Final   Special Requests BOTTLES DRAWN AEROBIC AND ANAEROBIC 5 CC   Final   Culture  Setup Time     Final   Value: 07/21/2014 00:26      Performed at [facility]   Culture     Final   Value:        BLOOD CULTURE RECEIVED NO GROWTH TO DATE CULTURE WILL BE HELD FOR 5 DAYS BEFORE ISSUING A FINAL NEGATIVE REPORT     Performed at [facility]   Report Status PENDING   Incomplete  CULTURE, BLOOD (ROUTINE X 2)     Status: None   Collection Time    07/20/14  6:13  PM      Result Value Ref Range Status   Specimen Description BLOOD RIGHT HAND   Final   Special Requests BOTTLES DRAWN AEROBIC ONLY 3CC   Final   Culture  Setup Time     Final   Value: 07/21/2014 00:26     Performed at [facility]   Culture     Final   Value:        BLOOD CULTURE RECEIVED NO GROWTH TO DATE CULTURE WILL BE HELD FOR 5 DAYS BEFORE ISSUING A FINAL NEGATIVE REPORT     Performed at [facility]   Report Status PENDING   Incomplete  CLOSTRIDIUM DIFFICILE BY PCR     Status: None   Collection Time    07/22/14  3:51 AM      Result Value Ref Range Status   C difficile by pcr NEGATIVE  NEGATIVE Final  STOOL CULTURE     Status: None   Collection Time    07/22/14  3:51 AM      Result Value Ref Range Status   Specimen Description STOOL   Final   Special Requests NONE   Final   Culture     Final   Value: NO SALMONELLA, SHIGELLA, CAMPYLOBACTER, YERSINIA, OR E.COLI 0157:H7 ISOLATED     Performed at [facility]   Report Status 07/25/2014 FINAL   Final     Labs: Basic Metabolic Panel:  Recent Labs Lab 07/20/14 1545 07/21/14 0320 07/23/14 1137 07/24/14 0800  NA 138 140 137 133*  K 4.4 3.7 4.1 4.3  CL 103 107 104 100  CO2 GLUCOSE 159* 150* 111* 104*  BUN CREATININE 0.87 0.90 0.66 0.69  CALCIUM 8.4 7.7* 7.8* 8.0*   Liver Function Tests:  Recent Labs Lab 07/22/14 1350  AST 16  ALT 9  ALKPHOS 55  BILITOT 0.3  PROT 5.2*  ALBUMIN 2.5*   No results found for this basename: LIPASE, AMYLASE,  in the last 168 hours No results found for this basename: AMMONIA,  in the last 168  hours CBC:  Recent Labs Lab 07/20/14 1545 07/21/14 0320 07/23/14 1137 07/23/14 1308 07/24/14 0800 07/24/14 1910  WBC 5.0 3.7* 4.0 3.7* 4.4 3.9*  NEUTROABS 3.9  --   --   --   --   --   HGB 9.0* 7.4* 6.4* 6.3* 7.7* 10.5*  HCT 27.4* 22.9* 19.7* 19.7* 22.9* 31.2*  MCV 93.8 94.2 92.9 92.9 88.8 88.6  PLT 128* 98* 94* 89* 99* 102*   Cardiac Enzymes:  Recent Labs Lab 07/20/14 2158 07/21/14 0320 07/21/14 1000  TROPONINI <0.30 <0.30 <0.30   BNP: BNP (last 3 results) No results found for this basename: PROBNP,  in the last 8760 hours CBG:  Recent Labs Lab 07/24/14 0736 07/24/14 1138 07/24/14 1622 07/24/14 2035 07/25/14 0734  GLUCAP 80 92 121* 112* 99       SignedCalvert Cantor, MD [facility] 07/25/2014, 10:36 AM

## 2014-07-25 NOTE — Progress Notes (Signed)
CSW (Clinical Child psychotherapist) received consult for SNF placement. Plan is for pt to dc home today. Pt has no hospital social work needs at this time.  Nell Schrack, LCSWA [phone]

## 2014-07-27 LAB — CULTURE, BLOOD (ROUTINE X 2)
Culture: NO GROWTH
Culture: NO GROWTH

## 2014-08-11 ENCOUNTER — Ambulatory Visit: Payer: Medicare Other | Admitting: Family Medicine

## 2014-08-18 ENCOUNTER — Ambulatory Visit: Payer: Medicare Other | Admitting: Internal Medicine

## 2014-09-09 ENCOUNTER — Inpatient Hospital Stay (HOSPITAL_COMMUNITY): Payer: Medicare Other

## 2014-09-09 ENCOUNTER — Emergency Department (HOSPITAL_COMMUNITY): Payer: Medicare Other

## 2014-09-09 ENCOUNTER — Inpatient Hospital Stay (HOSPITAL_COMMUNITY)
Admission: EM | Admit: 2014-09-09 | Discharge: 2014-09-16 | DRG: 299 | Disposition: A | Discharge status: Home / Self Care | Payer: Medicare Other | Attending: Internal Medicine | Admitting: Internal Medicine

## 2014-09-09 ENCOUNTER — Encounter (HOSPITAL_COMMUNITY): Payer: Self-pay | Admitting: Emergency Medicine

## 2014-09-09 DIAGNOSIS — G9340 Encephalopathy, unspecified: Secondary | ICD-10-CM | POA: Diagnosis present

## 2014-09-09 DIAGNOSIS — Z8673 Personal history of transient ischemic attack (TIA), and cerebral infarction without residual deficits: Secondary | ICD-10-CM | POA: Diagnosis not present

## 2014-09-09 DIAGNOSIS — R7881 Bacteremia: Secondary | ICD-10-CM | POA: Diagnosis present

## 2014-09-09 DIAGNOSIS — Z89611 Acquired absence of right leg above knee: Secondary | ICD-10-CM

## 2014-09-09 DIAGNOSIS — J9600 Acute respiratory failure, unspecified whether with hypoxia or hypercapnia: Secondary | ICD-10-CM

## 2014-09-09 DIAGNOSIS — D696 Thrombocytopenia, unspecified: Secondary | ICD-10-CM | POA: Diagnosis present

## 2014-09-09 DIAGNOSIS — E876 Hypokalemia: Secondary | ICD-10-CM | POA: Diagnosis not present

## 2014-09-09 DIAGNOSIS — Z7902 Long term (current) use of antithrombotics/antiplatelets: Secondary | ICD-10-CM | POA: Diagnosis not present

## 2014-09-09 DIAGNOSIS — Z79899 Other long term (current) drug therapy: Secondary | ICD-10-CM | POA: Diagnosis not present

## 2014-09-09 DIAGNOSIS — R571 Hypovolemic shock: Secondary | ICD-10-CM | POA: Diagnosis present

## 2014-09-09 DIAGNOSIS — K922 Gastrointestinal hemorrhage, unspecified: Secondary | ICD-10-CM | POA: Diagnosis present

## 2014-09-09 DIAGNOSIS — E870 Hyperosmolality and hypernatremia: Secondary | ICD-10-CM | POA: Diagnosis present

## 2014-09-09 DIAGNOSIS — Z66 Do not resuscitate: Secondary | ICD-10-CM | POA: Diagnosis present

## 2014-09-09 DIAGNOSIS — I129 Hypertensive chronic kidney disease with stage 1 through stage 4 chronic kidney disease, or unspecified chronic kidney disease: Secondary | ICD-10-CM | POA: Diagnosis present

## 2014-09-09 DIAGNOSIS — D519 Vitamin B12 deficiency anemia, unspecified: Secondary | ICD-10-CM

## 2014-09-09 DIAGNOSIS — K7200 Acute and subacute hepatic failure without coma: Secondary | ICD-10-CM | POA: Diagnosis present

## 2014-09-09 DIAGNOSIS — D62 Acute posthemorrhagic anemia: Secondary | ICD-10-CM | POA: Diagnosis present

## 2014-09-09 DIAGNOSIS — Z7982 Long term (current) use of aspirin: Secondary | ICD-10-CM | POA: Diagnosis not present

## 2014-09-09 DIAGNOSIS — E119 Type 2 diabetes mellitus without complications: Secondary | ICD-10-CM

## 2014-09-09 DIAGNOSIS — N179 Acute kidney failure, unspecified: Secondary | ICD-10-CM | POA: Diagnosis present

## 2014-09-09 DIAGNOSIS — E878 Other disorders of electrolyte and fluid balance, not elsewhere classified: Secondary | ICD-10-CM | POA: Diagnosis present

## 2014-09-09 DIAGNOSIS — R195 Other fecal abnormalities: Secondary | ICD-10-CM

## 2014-09-09 DIAGNOSIS — I2510 Atherosclerotic heart disease of native coronary artery without angina pectoris: Secondary | ICD-10-CM | POA: Diagnosis present

## 2014-09-09 DIAGNOSIS — R40244 Other coma, without documented Glasgow coma scale score, or with partial score reported, unspecified time: Secondary | ICD-10-CM

## 2014-09-09 DIAGNOSIS — J9601 Acute respiratory failure with hypoxia: Secondary | ICD-10-CM | POA: Diagnosis present

## 2014-09-09 DIAGNOSIS — Z515 Encounter for palliative care: Secondary | ICD-10-CM | POA: Diagnosis not present

## 2014-09-09 DIAGNOSIS — R778 Other specified abnormalities of plasma proteins: Secondary | ICD-10-CM | POA: Diagnosis present

## 2014-09-09 DIAGNOSIS — D649 Anemia, unspecified: Secondary | ICD-10-CM

## 2014-09-09 DIAGNOSIS — Z89612 Acquired absence of left leg above knee: Secondary | ICD-10-CM | POA: Diagnosis not present

## 2014-09-09 DIAGNOSIS — N390 Urinary tract infection, site not specified: Secondary | ICD-10-CM | POA: Diagnosis present

## 2014-09-09 DIAGNOSIS — Z87891 Personal history of nicotine dependence: Secondary | ICD-10-CM

## 2014-09-09 DIAGNOSIS — Q2733 Arteriovenous malformation of digestive system vessel: Secondary | ICD-10-CM | POA: Diagnosis not present

## 2014-09-09 DIAGNOSIS — R4182 Altered mental status, unspecified: Secondary | ICD-10-CM

## 2014-09-09 DIAGNOSIS — R578 Other shock: Secondary | ICD-10-CM | POA: Diagnosis present

## 2014-09-09 DIAGNOSIS — D631 Anemia in chronic kidney disease: Secondary | ICD-10-CM | POA: Diagnosis present

## 2014-09-09 DIAGNOSIS — E785 Hyperlipidemia, unspecified: Secondary | ICD-10-CM | POA: Diagnosis present

## 2014-09-09 DIAGNOSIS — I248 Other forms of acute ischemic heart disease: Secondary | ICD-10-CM | POA: Diagnosis present

## 2014-09-09 DIAGNOSIS — I509 Heart failure, unspecified: Secondary | ICD-10-CM | POA: Diagnosis present

## 2014-09-09 DIAGNOSIS — Z794 Long term (current) use of insulin: Secondary | ICD-10-CM | POA: Diagnosis not present

## 2014-09-09 DIAGNOSIS — R079 Chest pain, unspecified: Secondary | ICD-10-CM

## 2014-09-09 DIAGNOSIS — N189 Chronic kidney disease, unspecified: Secondary | ICD-10-CM | POA: Diagnosis present

## 2014-09-09 DIAGNOSIS — N170 Acute kidney failure with tubular necrosis: Secondary | ICD-10-CM

## 2014-09-09 DIAGNOSIS — E11649 Type 2 diabetes mellitus with hypoglycemia without coma: Secondary | ICD-10-CM | POA: Diagnosis present

## 2014-09-09 DIAGNOSIS — E162 Hypoglycemia, unspecified: Secondary | ICD-10-CM

## 2014-09-09 DIAGNOSIS — R40243 Glasgow coma scale score 3-8, unspecified time: Secondary | ICD-10-CM

## 2014-09-09 DIAGNOSIS — R7989 Other specified abnormal findings of blood chemistry: Secondary | ICD-10-CM | POA: Diagnosis present

## 2014-09-09 HISTORY — DX: Reserved for concepts with insufficient information to code with codable children: IMO0002

## 2014-09-09 HISTORY — DX: Calculus of gallbladder without cholecystitis without obstruction: K80.20

## 2014-09-09 HISTORY — DX: Unspecified atrial fibrillation: I48.91

## 2014-09-09 HISTORY — DX: Chronic obstructive pulmonary disease, unspecified: J44.9

## 2014-09-09 HISTORY — DX: Unspecified protein-calorie malnutrition: E46

## 2014-09-09 HISTORY — DX: Shortness of breath: R06.02

## 2014-09-09 LAB — MRSA PCR SCREENING: MRSA by PCR: NEGATIVE

## 2014-09-09 LAB — URINALYSIS, ROUTINE W REFLEX MICROSCOPIC
GLUCOSE, UA: NEGATIVE mg/dL
Ketones, ur: NEGATIVE mg/dL
Nitrite: POSITIVE — AB
PROTEIN: 100 mg/dL — AB
Specific Gravity, Urine: 1.016 (ref 1.005–1.030)
Urobilinogen, UA: 1 mg/dL (ref 0.0–1.0)
pH: 5 (ref 5.0–8.0)

## 2014-09-09 LAB — COMPREHENSIVE METABOLIC PANEL
ALT: 101 U/L — ABNORMAL HIGH (ref 0–53)
ANION GAP: 18 — AB (ref 5–15)
AST: 217 U/L — ABNORMAL HIGH (ref 0–37)
Albumin: 2.8 g/dL — ABNORMAL LOW (ref 3.5–5.2)
Alkaline Phosphatase: 57 U/L (ref 39–117)
BILIRUBIN TOTAL: 0.7 mg/dL (ref 0.3–1.2)
BUN: 55 mg/dL — AB (ref 6–23)
CHLORIDE: 116 meq/L — AB (ref 96–112)
CO2: 23 meq/L (ref 19–32)
CREATININE: 1.44 mg/dL — AB (ref 0.50–1.35)
Calcium: 9.1 mg/dL (ref 8.4–10.5)
GFR calc Af Amer: 53 mL/min — ABNORMAL LOW (ref >90)
GFR calc non Af Amer: 45 mL/min — ABNORMAL LOW (ref >90)
Glucose, Bld: 159 mg/dL — ABNORMAL HIGH (ref 70–99)
Potassium: 3.8 mEq/L (ref 3.7–5.3)
Sodium: 157 mEq/L — ABNORMAL HIGH (ref 137–147)
Total Protein: 6.3 g/dL (ref 6.0–8.3)

## 2014-09-09 LAB — I-STAT CHEM 8, ED
BUN: 57 mg/dL — AB (ref 6–23)
CHLORIDE: 117 meq/L — AB (ref 96–112)
Calcium, Ion: 1.2 mmol/L (ref 1.13–1.30)
Creatinine, Ser: 1.6 mg/dL — ABNORMAL HIGH (ref 0.50–1.35)
Glucose, Bld: 157 mg/dL — ABNORMAL HIGH (ref 70–99)
HEMATOCRIT: 15 % — AB (ref 39.0–52.0)
Hemoglobin: 5.1 g/dL — CL (ref 13.0–17.0)
Potassium: 3.5 mEq/L — ABNORMAL LOW (ref 3.7–5.3)
SODIUM: 155 meq/L — AB (ref 137–147)
TCO2: 24 mmol/L (ref 0–100)

## 2014-09-09 LAB — CBG MONITORING, ED: Glucose-Capillary: 41 mg/dL — CL (ref 70–99)

## 2014-09-09 LAB — CBC WITH DIFFERENTIAL/PLATELET
Basophils Absolute: 0 10*3/uL (ref 0.0–0.1)
Basophils Relative: 0 % (ref 0–1)
Eosinophils Absolute: 0 10*3/uL (ref 0.0–0.7)
Eosinophils Relative: 0 % (ref 0–5)
HEMATOCRIT: 15.9 % — AB (ref 39.0–52.0)
HEMOGLOBIN: 4.9 g/dL — AB (ref 13.0–17.0)
LYMPHS PCT: 5 % — AB (ref 12–46)
Lymphs Abs: 0.6 10*3/uL — ABNORMAL LOW (ref 0.7–4.0)
MCH: 30.1 pg (ref 26.0–34.0)
MCHC: 30.8 g/dL (ref 30.0–36.0)
MCV: 97.5 fL (ref 78.0–100.0)
MONO ABS: 0.8 10*3/uL (ref 0.1–1.0)
MONOS PCT: 6 % (ref 3–12)
Neutro Abs: 11 10*3/uL — ABNORMAL HIGH (ref 1.7–7.7)
Neutrophils Relative %: 89 % — ABNORMAL HIGH (ref 43–77)
Platelets: 160 10*3/uL (ref 150–400)
RBC: 1.63 MIL/uL — ABNORMAL LOW (ref 4.22–5.81)
RDW: 20.3 % — ABNORMAL HIGH (ref 11.5–15.5)
WBC: 12.4 10*3/uL — AB (ref 4.0–10.5)

## 2014-09-09 LAB — URINE MICROSCOPIC-ADD ON

## 2014-09-09 LAB — I-STAT CG4 LACTIC ACID, ED: Lactic Acid, Venous: 3.77 mmol/L — ABNORMAL HIGH (ref 0.5–2.2)

## 2014-09-09 LAB — TROPONIN I: TROPONIN I: 0.59 ng/mL — AB (ref <0.30)

## 2014-09-09 LAB — POC OCCULT BLOOD, ED: FECAL OCCULT BLD: POSITIVE — AB

## 2014-09-09 LAB — RAPID URINE DRUG SCREEN, HOSP PERFORMED
AMPHETAMINES: NOT DETECTED
Barbiturates: NOT DETECTED
Benzodiazepines: NOT DETECTED
Cocaine: NOT DETECTED
OPIATES: NOT DETECTED
TETRAHYDROCANNABINOL: NOT DETECTED

## 2014-09-09 LAB — PREPARE RBC (CROSSMATCH)

## 2014-09-09 LAB — AMMONIA: Ammonia: 22 umol/L (ref 11–60)

## 2014-09-09 MED ORDER — MORPHINE SULFATE 2 MG/ML IJ SOLN: 1.0000 mg | INTRAMUSCULAR | Status: DC | PRN

## 2014-09-09 MED ORDER — DEXTROSE 50 % IV SOLN
50.0000 mL | Freq: Once | INTRAVENOUS | Status: AC
  Administered 2014-09-09: 50 mL via INTRAVENOUS

## 2014-09-09 MED ORDER — SODIUM CHLORIDE 0.9 % IV SOLN: 10.0000 mL/h | Freq: Once | INTRAVENOUS | Status: DC

## 2014-09-09 MED ORDER — DEXTROSE-NACL 5-0.45 % IV SOLN
INTRAVENOUS | Status: DC
  Administered 2014-09-10: 75 mL/h via INTRAVENOUS
  Administered 2014-09-11: 1000 mL via INTRAVENOUS
  Administered 2014-09-11 – 2014-09-15 (×5): via INTRAVENOUS

## 2014-09-09 MED ORDER — MORPHINE SULFATE 2 MG/ML IJ SOLN
1.0000 mg | INTRAMUSCULAR | Status: DC | PRN
  Administered 2014-09-10 – 2014-09-15 (×11): 1 mg via INTRAVENOUS
  Filled 2014-09-09 (×12): qty 1

## 2014-09-09 MED ORDER — SODIUM CHLORIDE 0.9 % IV BOLUS (SEPSIS)
1000.0000 mL | Freq: Once | INTRAVENOUS | Status: AC
  Administered 2014-09-09: 1000 mL via INTRAVENOUS

## 2014-09-09 MED ORDER — DEXTROSE 50 % IV SOLN
INTRAVENOUS | Status: AC
  Filled 2014-09-09: qty 50

## 2014-09-09 MED ORDER — PANTOPRAZOLE SODIUM 40 MG IV SOLR: 40.0000 mg | Freq: Two times a day (BID) | INTRAVENOUS | Status: DC

## 2014-09-09 MED ORDER — ALBUTEROL SULFATE (2.5 MG/3ML) 0.083% IN NEBU
2.5000 mg | INHALATION_SOLUTION | RESPIRATORY_TRACT | Status: DC | PRN
Start: 2014-09-09 — End: 2014-09-16

## 2014-09-09 MED ORDER — SODIUM CHLORIDE 0.9 % IV BOLUS (SEPSIS)
1000.0000 mL | Freq: Once | INTRAVENOUS | Status: AC
Start: 2014-09-09 — End: 2014-09-09
  Administered 2014-09-09: 1000 mL via INTRAVENOUS

## 2014-09-09 MED ORDER — LORAZEPAM 2 MG/ML IJ SOLN: 0.5000 mg | Freq: Four times a day (QID) | INTRAMUSCULAR | Status: DC | PRN

## 2014-09-09 MED ORDER — ONDANSETRON HCL 4 MG/2ML IJ SOLN: 4.0000 mg | Freq: Four times a day (QID) | INTRAMUSCULAR | Status: DC | PRN

## 2014-09-09 MED ORDER — SODIUM CHLORIDE 0.9 % IV SOLN
80.0000 mg | Freq: Once | INTRAVENOUS | Status: AC
  Administered 2014-09-09: 80 mg via INTRAVENOUS
  Filled 2014-09-09: qty 80

## 2014-09-09 MED ORDER — DEXTROSE 5 % IV SOLN
1.0000 g | INTRAVENOUS | Status: DC
  Administered 2014-09-09 – 2014-09-13 (×5): 1 g via INTRAVENOUS
  Filled 2014-09-09 (×6): qty 10

## 2014-09-09 MED ORDER — MORPHINE SULFATE 4 MG/ML IJ SOLN: 4.0000 mg | Freq: Once | INTRAMUSCULAR | Status: DC

## 2014-09-09 MED ORDER — ONDANSETRON HCL 4 MG PO TABS: 4.0000 mg | ORAL_TABLET | Freq: Four times a day (QID) | ORAL | Status: DC | PRN

## 2014-09-09 MED ORDER — SODIUM CHLORIDE 0.9 % IV SOLN
8.0000 mg/h | INTRAVENOUS | Status: DC
  Administered 2014-09-09: 8 mg/h via INTRAVENOUS
  Filled 2014-09-09 (×4): qty 80

## 2014-09-09 MED ORDER — SODIUM CHLORIDE 0.9 % IJ SOLN
3.0000 mL | Freq: Two times a day (BID) | INTRAMUSCULAR | Status: DC
  Administered 2014-09-09 – 2014-09-15 (×7): 3 mL via INTRAVENOUS

## 2014-09-09 NOTE — Consult Note (Signed)
PULMONARY / CRITICAL CARE MEDICINE   Name: Jeris Pentarthur Godino MRN: [id number] DOB: [birth year]    ADMISSION DATE:  09/09/2014 CONSULTATION DATE:  09/09/2014  REFERRING MD :  EDP  CHIEF COMPLAINT:  AMS, respiratory failure and hypovolemic shock  INITIAL PRESENTATION: 79 year old poorly controlled diabetic bilateral lower ext amputee who lives with his daughter.  For the past two days daughter has noticed that patient has been deteriorating from a mental status standpoint.  Worsening also has been his inability to maintain PO intake.  Younger daughter came to visit on the day of presentation and noted that the patient is having worsening SOB, some chest pain and AMS.  EMS was called and patient was brought to the ER.  Originally patient was DNR and was going to be admitted for comfort measures.  However, family changed their mind and patient was changed back to full code and PCCM was called to admit.  STUDIES:  None  SIGNIFICANT EVENTS: 10/27 admission to the hospital  PAST MEDICAL HISTORY :   has a past medical history of Diabetes mellitus; Hypertension; Myocardial infarction (2005); Foot pain; Blood clot in vein; Blindness temporary; Joint pain; Loss of appetite; Hyperlipidemia; CAD (coronary artery disease); and Stroke.  has past surgical history that includes vein bypass graft,aorto-fem-pop and Above knee leg amputaton. Prior to Admission medications   Medication Sig Start Date End Date Taking? Authorizing Provider  aspirin EC 81 MG tablet Take 1 tablet (81 mg total) by mouth daily. 07/25/14   Calvert CantorSaima Rizwan, MD  atorvastatin (LIPITOR) 80 MG tablet Take 80 mg by mouth daily.      Historical Provider, MD  clopidogrel (PLAVIX) 75 MG tablet Take 75 mg by mouth daily.      Historical Provider, MD  cyanocobalamin (,VITAMIN B-12,) 1000 MCG/ML injection Inject 1 mL (1,000 mcg total) into the muscle every 30 (thirty) days. 07/25/14   Calvert CantorSaima Rizwan, MD  metoprolol succinate (TOPROL-XL) 25 MG 24 hr tablet  Take 25 mg by mouth daily. 07/31/14   Historical Provider, MD  pioglitazone (ACTOS) 45 MG tablet Take 45 mg by mouth daily.      Historical Provider, MD  traMADol (ULTRAM) 50 MG tablet Take 50 mg by mouth every 6 (six) hours as needed. 06/19/14   Historical Provider, MD   No Known Allergies  FAMILY HISTORY:  has no family status information on file.  SOCIAL HISTORY:  reports that he quit smoking about 11 years ago. His smoking use included Cigarettes. He smoked 0.00 packs per day. He does not have any smokeless tobacco history on file. He reports that he does not drink alcohol.  REVIEW OF SYSTEMS:  Unattainable, patient is barely arousable.  SUBJECTIVE:   VITAL SIGNS: Temp:  [99.8 F (37.7 C)] 99.8 F (37.7 C) (10/27 1351) Pulse Rate:  [26-83] 72 (10/27 1707) Resp:  [10-24] 18 (10/27 1707) BP: (74-119)/(18-68) 74/51 mmHg (10/27 1707) SpO2:  [92 %-100 %] 92 % (10/27 1515) Weight:  [46.72 kg (103 lb)] 46.72 kg (103 lb) (10/27 1351) HEMODYNAMICS:   VENTILATOR SETTINGS:   INTAKE / OUTPUT: No intake or output data in the 24 hours ending 09/09/14 1711  PHYSICAL EXAMINATION: General:  Chronically ill appearing male, unresponsive, low RR. Neuro:  Unresponsive, spontaneously moving upper ext but not lower (bilateral lower ext amputee however). HEENT:  [place]/AT, PERRL, EOM-spontaneous and DMM. Cardiovascular:  IRIR, Nl S1/S2, -M/R/G. Lungs:  Decrease BS diffusely. Abdomen:  Soft, NT, ND and +BS. Musculoskeletal:  Bilateral lower ext amputee. Skin:  Multiple vein puncture sites that are poorly healing.  LABS:  CBC  Recent Labs Lab 09/09/14 1429 09/09/14 1500  WBC 12.4*  --   HGB 4.9* 5.1*  HCT 15.9* 15.0*  PLT 160  --    Coag's No results found for this basename: APTT, INR,  in the last 168 hours BMET  Recent Labs Lab 09/09/14 1429 09/09/14 1500  NA 157* 155*  K 3.8 3.5*  CL 116* 117*  CO2 23  --   BUN 55* 57*  CREATININE 1.44* 1.60*  GLUCOSE 159* 157*    Electrolytes  Recent Labs Lab 09/09/14 1429  CALCIUM 9.1   Sepsis Markers  Recent Labs Lab 09/09/14 1445  LATICACIDVEN 3.77*   ABG No results found for this basename: PHART, PCO2ART, PO2ART,  in the last 168 hours Liver Enzymes  Recent Labs Lab 09/09/14 1429  AST 217*  ALT 101*  ALKPHOS 57  BILITOT 0.7  ALBUMIN 2.8*   Cardiac Enzymes  Recent Labs Lab 09/09/14 1429  TROPONINI 0.59*   Glucose  Recent Labs Lab 09/09/14 1454  GLUCAP 41*    Imaging No results found.   ASSESSMENT / PLAN:  PULMONARY OETT N/A A: Respiratory failure likely developing due to AMS stemming from hypotension vs CVA, see below. P:   - Patient is DNR. - No intubation. - Supplemental O2. - Unlikely to be protecting his airway.  CARDIOVASCULAR CVL N/A A: Likely cardiogenic vs hypovolemic shock. P:  - Fluid resuscitate. - Cycle enzymes. - If survives til tomorrow then will likely need an echo.  RENAL A:  ARF.  Hypernatremia.  Hyperchloremia. P:   - Hydrate. - Replace electrolytes as indicated. - Monitor BMET in AM if patient is to survive the night.  GASTROINTESTINAL A:  ?GI bleed. P:   - NPO. - Protonix.  HEMATOLOGIC A:  Low Hg, ?GI bleeding. P:  - Transfuse.  INFECTIOUS A:  No evidence of infection. P:   - Defer cultures and abx for now.  ENDOCRINE A:  DM by history.  ?low cortisol.   P:   - Check cortisol level. - Stress dose steroids. - CBGs and ISS.  NEUROLOGIC A:  AMS ?due to hypotension vs primary CVA. P:   - No sedating agents. - ?head CT, will defer to primary. - Maintain BP with IVF.  FAMILY  - Updates: daughters both bedside along with other family members.  After discussion, decision was made to make patient DNR, treat a above with no further escalation, if improves then will continue treatment.  If deteriorates then comfort care.  I have personally obtained a history, examined the patient, evaluated laboratory and imaging results,  formulated the assessment and plan and placed orders.  CRITICAL CARE: The patient is critically ill with multiple organ systems failure and requires high complexity decision making for assessment and support, frequent evaluation and titration of therapies, application of advanced monitoring technologies and extensive interpretation of multiple databases. Critical Care Time devoted to patient care services described in this note is 40 minutes.   Alyson ReedyWesam G. Venezia Sargeant, M.D. [facility]. Pager: [phone]. After hours pager: [phone].  09/09/2014, 5:11 PM

## 2014-09-09 NOTE — Progress Notes (Signed)
Chaplain Hamilton introduced to me to daughter of pt, [name], as I was coming on for overnight. I offered hospitality and let [name] know I will try to check on them once pt has been moved up to a unit. Please page chaplain if needed.   09/09/14 1726  Clinical Encounter Type  Visited With Family  Visit Type Follow-up  Referral From Chaplain;Nurse  Consult/Referral To Chaplain    Wille GlaserMcCray, Itxel Wickard O, Chaplain 09/09/2014 5:28 PM

## 2014-09-09 NOTE — ED Notes (Signed)
Pt's family talking to Dr. Elesa MassedWard.  St's they are undecided ref. Giving pt more than comfort measures.  Report given to 5W but now critical care MD has been called for possible central line placement for blood transfusions and IV pressors.

## 2014-09-09 NOTE — ED Provider Notes (Signed)
Medical screening examination/treatment/procedure(s) were conducted as a shared visit with non-physician practitioner(s) and myself.  I personally evaluated the patient during the encounter.   EKG Interpretation   Date/Time:  Tuesday September 09 2014 14:15:42 EDT Ventricular Rate:  78 PR Interval:  152 QRS Duration: 150 QT Interval:  443 QTC Calculation: 505 R Axis:   -94 Text Interpretation:  Sinus rhythm Multiple ventricular premature  complexes Nonspecific IVCD with LAD ED PHYSICIAN INTERPRETATION AVAILABLE  IN [facility] Confirmed by TEST, Record ([id number]) on 09/11/2014 8:28:36  AM       Pt is a 79 y.o. M history of hypertension, diabetes, coronary artery disease, prior stroke, a fib, COPD, peripheral vascular disease status post bilateral AKA's who presented emergency department with complaints of three days of altered mental status. Patient initially was hemodynamically stable and obtaining his workup and quickly decompensated becoming bradycardia, hypotension and had decreased agonal respirations. Multiple conversations were spent with family you had a very difficult time deciding what course of care they wanted to continue. Patient was found to be extremely anemic,hypoglycemic with acute renal failure. Family initially wanted comfort care measures only and made the patient a DNR/DNI.  They subsequently changed their mind and decided they wanted further treatment including blood transfusion. Four units of packed red blood cells were ordered in the emergency department. They decided they would not want any further procedures including endoscopy or colonoscopy for possible G.I. Bleed, central line or vasopressors. He will be a DNR/DNI.  Medicine to admit patient. Critical care has also evaluated patient in discussed options with family.    CRITICAL CARE Performed by: Raelyn NumberWARD, Britiany Silbernagel N   Total critical care time: 60 minutes  Critical care time was exclusive of separately billable  procedures and treating other patients.  Critical care was necessary to treat or prevent imminent or life-threatening deterioration.  Critical care was time spent personally by me on the following activities: development of treatment plan with patient and/or surrogate as well as nursing, discussions with consultants, evaluation of patient's response to treatment, examination of patient, obtaining history from patient or surrogate, ordering and performing treatments and interventions, ordering and review of laboratory studies, ordering and review of radiographic studies, pulse oximetry and re-evaluation of patient's condition.   Layla MawKristen N Dajah Fischman, DO 09/11/14 1314

## 2014-09-09 NOTE — ED Notes (Signed)
Pt resp rate dropped- very shallow, being bagged --pulse ox 100%--bigeminy on monitor---dr ward talking to family.

## 2014-09-09 NOTE — Progress Notes (Signed)
Patient's family now want patient to be full code. RN informed that patient will now be admitted into ICU

## 2014-09-09 NOTE — ED Notes (Signed)
Pt to CT at this time.

## 2014-09-09 NOTE — H&P (Signed)
Patient Demographics  [name], is a 79 y.o. male  MRN: [id number]   DOB - 17-Sep-1935  Admit Date - 09/09/2014  Outpatient Primary MD for the patient is Dorrene GermanAVBUERE,EDWIN A, MD   With History of -  Past Medical History  Diagnosis Date  . Diabetes mellitus   . Hypertension   . Myocardial infarction 2005  . Foot pain     while lying  . Blood clot in vein   . Blindness temporary   . Joint pain   . Loss of appetite   . Hyperlipidemia   . CAD (coronary artery disease)   . Stroke     resulting in left side weakness      Past Surgical History  Procedure Laterality Date  . Pr vein bypass graft,aorto-fem-pop    . Above knee leg amputation      right    in for   Chief Complaint  Patient presents with  . Altered Mental Status     HPI  Kevin Pentarthur Edds  is a 79 y.o. male, with past medical history significant for diabetes mellitus hypertension and coronary artery disease in addition to multiple strokes , recent admission in September 2015, with hemoglobin of 6.4 , which required transfusion , with negative Hemoccult done them, patient presents today with altered mental status, lethargy, workup did show hemoglobin of 4.9, heart failure with a creatinine of 1.6, elevated troponin, and hypernatremia of 155 , patient was Hemoccult positive, was unable to protect his airways, critical care were consulted, but patient was made DO NOT RESUSCITATE by the family at bedside, with family wishes for no aggressive measures, as for now just to continue with blood transfusions, fluids, antibiotics, but they didn't want any central lines, pressors, antiarrhythmics.     Review of Systems   Patient is lethargic, not able to provide any review of systems.   Social History History  Substance Use Topics  . Smoking status: Former Smoker    Types: Cigarettes    Quit date: 11/14/2002  . Smokeless tobacco: Not on file  . Alcohol Use: No     Family History No family history on  file.   Prior to Admission medications   Medication Sig Start Date End Date Taking? Authorizing Provider  aspirin EC 81 MG tablet Take 1 tablet (81 mg total) by mouth daily. 07/25/14   Calvert CantorSaima Rizwan, MD  atorvastatin (LIPITOR) 80 MG tablet Take 80 mg by mouth daily.      Historical Provider, MD  clopidogrel (PLAVIX) 75 MG tablet Take 75 mg by mouth daily.      Historical Provider, MD  cyanocobalamin (,VITAMIN B-12,) 1000 MCG/ML injection Inject 1 mL (1,000 mcg total) into the muscle every 30 (thirty) days. 07/25/14   Calvert CantorSaima Rizwan, MD  metoprolol succinate (TOPROL-XL) 25 MG 24 hr tablet Take 25 mg by mouth daily. 07/31/14   Historical Provider, MD  pioglitazone (ACTOS) 45 MG tablet Take 45 mg by mouth daily.      Historical Provider, MD  traMADol (ULTRAM) 50 MG tablet Take 50 mg by mouth every 6 (six) hours as needed. 06/19/14   Historical Provider, MD    No Known Allergies  Physical Exam  Vitals  Blood pressure 70/53, pulse 70, temperature 97.8 F (36.6 C), temperature source Oral, resp. rate 14, weight 46.72 kg (103 lb), SpO2 92.00%.   1. General frail, ill-appearing lying in bed in NAD,    2. Lethargic, minimally responsive to loud verbal stimuli.  3.  Ears and Eyes appear Normal, Conjunctivae clear, dry Oral Mucosa.  5. Supple Neck, No JVD, No cervical lymphadenopathy appriciated, No Carotid Bruits.  6. Symmetrical Chest wall movement, fair air movement bilaterally, CTAB.  7. RRR, No Gallops, Rubs or Murmurs, No Parasternal Heave.  8. Positive Bowel Sounds, Abdomen Soft, No tenderness, No organomegaly appriciated,No rebound -guarding or rigidity.  9.  No Cyanosis, delayed skin turgor  10. Bilateral AKA    Data Review  CBC  Recent Labs Lab 09/09/14 1429 09/09/14 1500  WBC 12.4*  --   HGB 4.9* 5.1*  HCT 15.9* 15.0*  PLT 160  --   MCV 97.5  --   MCH 30.1  --   MCHC 30.8  --   RDW 20.3*  --   LYMPHSABS 0.6*  --   MONOABS 0.8  --   EOSABS 0.0  --   BASOSABS 0.0   --    ------------------------------------------------------------------------------------------------------------------  Chemistries   Recent Labs Lab 09/09/14 1429 09/09/14 1500  NA 157* 155*  K 3.8 3.5*  CL 116* 117*  CO2 23  --   GLUCOSE 159* 157*  BUN 55* 57*  CREATININE 1.44* 1.60*  CALCIUM 9.1  --   AST 217*  --   ALT 101*  --   ALKPHOS 57  --   BILITOT 0.7  --    ------------------------------------------------------------------------------------------------------------------ CrCl is unknown because both a height and weight (above a minimum accepted value) are required for this calculation. ------------------------------------------------------------------------------------------------------------------ No results found for this basename: TSH, T4TOTAL, FREET3, T3FREE, THYROIDAB,  in the last 72 hours   Coagulation profile No results found for this basename: INR, PROTIME,  in the last 168 hours ------------------------------------------------------------------------------------------------------------------- No results found for this basename: DDIMER,  in the last 72 hours -------------------------------------------------------------------------------------------------------------------  Cardiac Enzymes  Recent Labs Lab 09/09/14 1429  TROPONINI 0.59*   ------------------------------------------------------------------------------------------------------------------ No components found with this basename: POCBNP,    ---------------------------------------------------------------------------------------------------------------  Urinalysis    Component Value Date/Time   COLORURINE AMBER* 09/09/2014 1752   APPEARANCEUR TURBID* 09/09/2014 1752   LABSPEC 1.016 09/09/2014 1752   PHURINE 5.0 09/09/2014 1752   GLUCOSEU NEGATIVE 09/09/2014 1752   HGBUR LARGE* 09/09/2014 1752   BILIRUBINUR SMALL* 09/09/2014 1752   KETONESUR NEGATIVE 09/09/2014 1752   PROTEINUR  100* 09/09/2014 1752   UROBILINOGEN 1.0 09/09/2014 1752   NITRITE POSITIVE* 09/09/2014 1752   LEUKOCYTESUR LARGE* 09/09/2014 1752    ----------------------------------------------------------------------------------------------------------------  Imaging results:   Dg Chest 1 View  09/09/2014   CLINICAL DATA:  Altered mental status.  EXAM: CHEST - 1 VIEW  COMPARISON:  July 22, 2014.  FINDINGS: The heart size and mediastinal contours are within normal limits. Both lungs are clear. No pneumothorax or pleural effusion is noted. Degenerative changes seen involving the left glenohumeral joint with subacromial narrowing suggesting rotator cuff injury.  IMPRESSION: No acute cardiopulmonary abnormality seen.   Electronically Signed   By: Roque LiasJames  Green M.D.   On: 09/09/2014 16:56   Ct Head Wo Contrast  09/09/2014   CLINICAL DATA:  Progressive worsening of mental status.  EXAM: CT HEAD WITHOUT CONTRAST  TECHNIQUE: Contiguous axial images were obtained from the base of the skull through the vertex without intravenous contrast.  COMPARISON:  07/20/2014  FINDINGS: There is a large area of encephalomalacia involving the right parietal lobe and right occipital lobe compatible with previous right MCA infarct. Chronic left cerebellar infarct is also identified. There is prominence of the sulci and ventricles compatible with brain atrophy. There is no  evidence for acute brain infarct, hemorrhage or intracranial mass. No abnormal extra-axial fluid collections identified. The paranasal sinuses appear clear. The mastoid air cells are also clear. The calvarium is intact.  IMPRESSION: 1. No acute intracranial abnormalities. 2. Chronic bilateral infarcts.   Electronically Signed   By: Signa Kell M.D.   On: 09/09/2014 18:16        Assessment & Plan  Principal Problem:   Hemorrhagic shock Active Problems:   Anemia   End of life care   Respiratory failure   Altered mental state   GI bleed   Acute renal  failure   Elevated troponin    Hemorrhagic shock/GI bleed -Advised this point family request conservative management including fluids and blood transfusions, they declined any pressors, central line. -As well. Request minimal blood work, so will recheck CBC in a.m. -Start on Protonix drip, will be transfused total of 4 units overnight. - Consulted Dr. Arlyce Dice  Acute encephalopathy/altered mental status -Appears to be multifactorial given his hypotension, anemia, and urine tract infection  Respiratory failure -Secondary to altered mental status -Patient is DO NOT INTUBATE, already seen by pulmonary/critical care  Elevated troponins -Most likely demand ischemia from hypotension and anemia, certainly patient is a candidate for aspirin or anticoagulation, at this point family does not wish to pursue any further workup.  UTI -Start on Rocephin  Acute renal failure -Secondary to volume depletion, hydrate   DVT Prophylaxis  SCDs   AM Labs Ordered, also please review Full Orders  Family Communication: Admission, patients condition and plan of care including tests being ordered have been discussed with the family including both daughters at bedside .who indicate understanding and agree with the plan and Code Status.  Code Status DO NOT RESUSCITATE/DO NOT INTUBATE, family do not wish for any aggressive measures, did not want any antiarrhythmic medications, pressors, central lines, and they want to keep the blood draws to the minimum, for now plan is for blood transfusion, fluids, antibiotics, and if blood pressure drops no intervention being planned besides fluids and blood transfusions.  Will admit to stepdown.  Condition GUARDED  /critical, overall poor prognosis.  Time spent in minutes : 75 minutes    ELGERGAWY, DAWOOD M.D on 09/09/2014 at 7:08 PM  Between 7am to 7pm - Pager - [phone]  After 7pm go to [email or web address] - password [email or web address]  And look for the night coverage person  covering me after hours  [facility] Office  [phone]   **Disclaimer: This note may have been dictated with voice recognition software. Similar sounding words can inadvertently be transcribed and this note may contain transcription errors which may not have been corrected upon publication of note.**

## 2014-09-09 NOTE — ED Provider Notes (Signed)
CSN: [id number]     Arrival date & time 09/09/14  1338 History   First MD Initiated Contact with Patient 09/09/14 1342     Chief Complaint  Patient presents with  . Altered Mental Status     (Consider location/radiation/quality/duration/timing/severity/associated sxs/prior Treatment) The history is provided by a relative. No language interpreter was used.  [name] is a 79 y/o M with PMHx of MI, stroke, DM, AKA bilaterally, HLD, CAD presenting to the ED, BIBEMS, regarding AMS - patient last seen normal yesterday. Patient currently living with daughter. As per daughter reported that he was admitted to the hospital last month regarding need for transfusion and recent stroke like symptoms. Stated that patient has been confused, does not hold a conversation like he normally does. Daughter reported that he has been having decrease in oral intake for the past couple of days and has not been acting like himself. Reported that patient had some shortness of breath today - daughter called EMS. ROS limited secondary to acuity of patient - level 5 caveat. Family denied fevers, vomiting. PCP Dr. Claudie Revering  Past Medical History  Diagnosis Date  . Diabetes mellitus   . Hypertension   . Myocardial infarction 2005  . Foot pain     while lying  . Blood clot in vein   . Blindness temporary   . Joint pain   . Loss of appetite   . Hyperlipidemia   . CAD (coronary artery disease)   . Stroke     resulting in left side weakness   Past Surgical History  Procedure Laterality Date  . Pr vein bypass graft,aorto-fem-pop    . Above knee leg amputation      right   No family history on file. History  Substance Use Topics  . Smoking status: Former Smoker    Types: Cigarettes    Quit date: 11/14/2002  . Smokeless tobacco: Not on file  . Alcohol Use: No    Review of Systems  Unable to perform ROS: Acuity of condition   Level 5 Caveat    Allergies  Review of patient's allergies indicates no  known allergies.  Home Medications   Prior to Admission medications   Medication Sig Start Date End Date Taking? Authorizing Provider  aspirin EC 81 MG tablet Take 1 tablet (81 mg total) by mouth daily. 07/25/14   Calvert Cantor, MD  atorvastatin (LIPITOR) 80 MG tablet Take 80 mg by mouth daily.      Historical Provider, MD  clopidogrel (PLAVIX) 75 MG tablet Take 75 mg by mouth daily.      Historical Provider, MD  cyanocobalamin (,VITAMIN B-12,) 1000 MCG/ML injection Inject 1 mL (1,000 mcg total) into the muscle every 30 (thirty) days. 07/25/14   Calvert Cantor, MD  metoprolol succinate (TOPROL-XL) 25 MG 24 hr tablet Take 25 mg by mouth daily. 07/31/14   Historical Provider, MD  pioglitazone (ACTOS) 45 MG tablet Take 45 mg by mouth daily.      Historical Provider, MD  traMADol (ULTRAM) 50 MG tablet Take 50 mg by mouth every 6 (six) hours as needed. 06/19/14   Historical Provider, MD   BP 87/16  Pulse 72  Temp(Src) 99.8 F (37.7 C) (Rectal)  Resp 14  Wt 103 lb (46.72 kg)  SpO2 92% Physical Exam  Nursing note and vitals reviewed. Constitutional: No distress.  Frail-appearing, emaciated elderly male  HENT:  Head: Normocephalic and atraumatic.  Dry mucous membranes  Eyes: Conjunctivae and EOM are normal. Pupils are  equal, round, and reactive to light. Right eye exhibits no discharge. Left eye exhibits no discharge.  Neck: Normal range of motion. Neck supple.  Cardiovascular: Normal rate, regular rhythm and normal heart sounds.  Exam reveals no friction rub.   No murmur heard. Pulses:      Radial pulses are 1+ on the right side, and 1+ on the left side.  Cap refill < 3 seconds  Faint radial pulses bilaterally   Pulmonary/Chest: Effort normal and breath sounds normal. No respiratory distress. He has no wheezes. He has no rales.  Musculoskeletal:  AKA bilaterally   Neurological:  Obtunded When call patient's name will turn head and look intermittently  Did not follow commands  Skin: Skin  is warm and dry. No rash noted. He is not diaphoretic. No erythema. There is pallor.  Pale skin     ED Course  Procedures (including critical care time)  Results for orders placed during the hospital encounter of 09/09/14  CBC WITH DIFFERENTIAL      Result Value Ref Range   WBC 12.4 (*) 4.0 - 10.5 K/uL   RBC 1.63 (*) 4.22 - 5.81 MIL/uL   Hemoglobin 4.9 (*) 13.0 - 17.0 g/dL   HCT 16.1 (*) 09.6 - 04.5 %   MCV 97.5  78.0 - 100.0 fL   MCH 30.1  26.0 - 34.0 pg   MCHC 30.8  30.0 - 36.0 g/dL   RDW 40.9 (*) 81.1 - 91.4 %   Platelets 160  150 - 400 K/uL   Neutrophils Relative % 89 (*) 43 - 77 %   Neutro Abs 11.0 (*) 1.7 - 7.7 K/uL   Lymphocytes Relative 5 (*) 12 - 46 %   Lymphs Abs 0.6 (*) 0.7 - 4.0 K/uL   Monocytes Relative 6  3 - 12 %   Monocytes Absolute 0.8  0.1 - 1.0 K/uL   Eosinophils Relative 0  0 - 5 %   Eosinophils Absolute 0.0  0.0 - 0.7 K/uL   Basophils Relative 0  0 - 1 %   Basophils Absolute 0.0  0.0 - 0.1 K/uL  COMPREHENSIVE METABOLIC PANEL      Result Value Ref Range   Sodium 157 (*) 137 - 147 mEq/L   Potassium 3.8  3.7 - 5.3 mEq/L   Chloride 116 (*) 96 - 112 mEq/L   CO2 23  19 - 32 mEq/L   Glucose, Bld 159 (*) 70 - 99 mg/dL   BUN 55 (*) 6 - 23 mg/dL   Creatinine, Ser 7.82 (*) 0.50 - 1.35 mg/dL   Calcium 9.1  8.4 - 95.6 mg/dL   Total Protein 6.3  6.0 - 8.3 g/dL   Albumin 2.8 (*) 3.5 - 5.2 g/dL   AST 213 (*) 0 - 37 U/L   ALT 101 (*) 0 - 53 U/L   Alkaline Phosphatase 57  39 - 117 U/L   Total Bilirubin 0.7  0.3 - 1.2 mg/dL   GFR calc non Af Amer 45 (*) >90 mL/min   GFR calc Af Amer 53 (*) >90 mL/min   Anion gap 18 (*) 5 - 15  TROPONIN I      Result Value Ref Range   Troponin I 0.59 (*) <0.30 ng/mL  AMMONIA      Result Value Ref Range   Ammonia 22  11 - 60 umol/L  I-STAT CG4 LACTIC ACID, ED      Result Value Ref Range   Lactic Acid, Venous 3.77 (*) 0.5 -  2.2 mmol/L  CBG MONITORING, ED      Result Value Ref Range   Glucose-Capillary 41 (*) 70 - 99 mg/dL   I-STAT CHEM 8, ED      Result Value Ref Range   Sodium 155 (*) 137 - 147 mEq/L   Potassium 3.5 (*) 3.7 - 5.3 mEq/L   Chloride 117 (*) 96 - 112 mEq/L   BUN 57 (*) 6 - 23 mg/dL   Creatinine, Ser 1.61 (*) 0.50 - 1.35 mg/dL   Glucose, Bld 096 (*) 70 - 99 mg/dL   Calcium, Ion 0.45  4.09 - 1.30 mmol/L   TCO2 24  0 - 100 mmol/L   Hemoglobin 5.1 (*) 13.0 - 17.0 g/dL   HCT 81.1 (*) 91.4 - 78.2 %   Comment NOTIFIED PHYSICIAN    POC OCCULT BLOOD, ED      Result Value Ref Range   Fecal Occult Bld POSITIVE (*) NEGATIVE  TYPE AND SCREEN      Result Value Ref Range   ABO/RH(D) O POS     Antibody Screen NEG     Sample Expiration 09/12/2014     Unit Number [id number]     Blood Component Type RED CELLS,LR     Unit division 00     Status of Unit ISSUED     Transfusion Status OK TO TRANSFUSE     Crossmatch Result Compatible     Unit Number [id number]     Blood Component Type RED CELLS,LR     Unit division 00     Status of Unit ALLOCATED     Transfusion Status OK TO TRANSFUSE     Crossmatch Result Compatible     Unit Number [id number]     Blood Component Type RED CELLS,LR     Unit division 00     Status of Unit ALLOCATED     Transfusion Status OK TO TRANSFUSE     Crossmatch Result Compatible     Unit Number [id number]     Blood Component Type RED CELLS,LR     Unit division 00     Status of Unit ALLOCATED     Transfusion Status OK TO TRANSFUSE     Crossmatch Result Compatible    PREPARE RBC (CROSSMATCH)      Result Value Ref Range   Order Confirmation ORDER PROCESSED BY BLOOD BANK      Labs Review Labs Reviewed  CBC WITH DIFFERENTIAL - Abnormal; Notable for the following:    WBC 12.4 (*)    RBC 1.63 (*)    Hemoglobin 4.9 (*)    HCT 15.9 (*)    RDW 20.3 (*)    Neutrophils Relative % 89 (*)    Neutro Abs 11.0 (*)    Lymphocytes Relative 5 (*)    Lymphs Abs 0.6 (*)    All other components within normal limits  COMPREHENSIVE METABOLIC PANEL - Abnormal; Notable for  the following:    Sodium 157 (*)    Chloride 116 (*)    Glucose, Bld 159 (*)    BUN 55 (*)    Creatinine, Ser 1.44 (*)    Albumin 2.8 (*)    AST 217 (*)    ALT 101 (*)    GFR calc non Af Amer 45 (*)    GFR calc Af Amer 53 (*)    Anion gap 18 (*)    All other components within normal limits  TROPONIN I - Abnormal; Notable for the following:  Troponin I 0.59 (*)    All other components within normal limits  I-STAT CG4 LACTIC ACID, ED - Abnormal; Notable for the following:    Lactic Acid, Venous 3.77 (*)    All other components within normal limits  CBG MONITORING, ED - Abnormal; Notable for the following:    Glucose-Capillary 41 (*)    All other components within normal limits  I-STAT CHEM 8, ED - Abnormal; Notable for the following:    Sodium 155 (*)    Potassium 3.5 (*)    Chloride 117 (*)    BUN 57 (*)    Creatinine, Ser 1.60 (*)    Glucose, Bld 157 (*)    Hemoglobin 5.1 (*)    HCT 15.0 (*)    All other components within normal limits  POC OCCULT BLOOD, ED - Abnormal; Notable for the following:    Fecal Occult Bld POSITIVE (*)    All other components within normal limits  CULTURE, BLOOD (ROUTINE X 2)  CULTURE, BLOOD (ROUTINE X 2)  URINE CULTURE  AMMONIA  URINALYSIS, ROUTINE W REFLEX MICROSCOPIC  URINE RAPID DRUG SCREEN (HOSP PERFORMED)  CBG MONITORING, ED  TYPE AND SCREEN  PREPARE RBC (CROSSMATCH)    Imaging Review Dg Chest 1 View  09/09/2014   CLINICAL DATA:  Altered mental status.  EXAM: CHEST - 1 VIEW  COMPARISON:  July 22, 2014.  FINDINGS: The heart size and mediastinal contours are within normal limits. Both lungs are clear. No pneumothorax or pleural effusion is noted. Degenerative changes seen involving the left glenohumeral joint with subacromial narrowing suggesting rotator cuff injury.  IMPRESSION: No acute cardiopulmonary abnormality seen.   Electronically Signed   By: Roque LiasJames  Green M.D.   On: 09/09/2014 16:56   Ct Head Wo Contrast  09/09/2014    CLINICAL DATA:  Progressive worsening of mental status.  EXAM: CT HEAD WITHOUT CONTRAST  TECHNIQUE: Contiguous axial images were obtained from the base of the skull through the vertex without intravenous contrast.  COMPARISON:  07/20/2014  FINDINGS: There is a large area of encephalomalacia involving the right parietal lobe and right occipital lobe compatible with previous right MCA infarct. Chronic left cerebellar infarct is also identified. There is prominence of the sulci and ventricles compatible with brain atrophy. There is no evidence for acute brain infarct, hemorrhage or intracranial mass. No abnormal extra-axial fluid collections identified. The paranasal sinuses appear clear. The mastoid air cells are also clear. The calvarium is intact.  IMPRESSION: 1. No acute intracranial abnormalities. 2. Chronic bilateral infarcts.   Electronically Signed   By: Signa Kellaylor  Stroud M.D.   On: 09/09/2014 18:16     EKG Interpretation None     CRITICAL CARE Performed by: Raymon MuttonSciacca, Kasmira Cacioppo   Total critical care time: 35  Critical care time was exclusive of separately billable procedures and treating other patients.  Critical care was necessary to treat or prevent imminent or life-threatening deterioration.  Critical care was time spent personally by me on the following activities: development of treatment plan with patient and/or surrogate as well as nursing, discussions with consultants, evaluation of patient's response to treatment, examination of patient, obtaining history from patient or surrogate, ordering and performing treatments and interventions, ordering and review of laboratory studies, ordering and review of radiographic studies, pulse oximetry and re-evaluation of patient's condition.  MDM   Final diagnoses:  Altered mental state  Anemia, unspecified anemia type  Hypoglycemia  Altered mental status    Medications  dextrose 50 % solution (  not administered)  0.9 %  sodium chloride  infusion (not administered)  morphine 4 MG/ML injection 4 mg (not administered)  dextrose 5 %-0.45 % sodium chloride infusion (not administered)  sodium chloride 0.9 % bolus 1,000 mL (1,000 mLs Intravenous New Bag/Given 09/09/14 1500)  sodium chloride 0.9 % bolus 1,000 mL (1,000 mLs Intravenous New Bag/Given 09/09/14 1656)  dextrose 50 % solution 50 mL (50 mLs Intravenous Given 09/09/14 1500)   Filed Vitals:   09/09/14 1700 09/09/14 1707 09/09/14 1715 09/09/14 1745  BP: 74/51 74/51 71/29  87/16  Pulse:  72    Temp:      TempSrc:      Resp: 27 18 22 14   Weight:      SpO2:       This provider reviewed the patient's chart. Patient last admitted to the hospital regarding a syncopal episode resulting in low Hgb where patient needed to be transfused. Patient was recently discharged on 07/25/2014. Patient had CT angiogram of neck performed with severe stenosis from previous admission. Patient had MRI of brain performed noting no acute abnormalities, chronic changes seen.  EKG noted normal sinus rhythm with a heart rate of 78 bpm with multiple PVCs. Elevated troponin of 0.59. CMP noted elevated but blood cell count of 12.4. Hemoglobin 4.9. CMP noted hyponatremia of 157 and elevated chloride of 116. Anion gap 18.0 mEq per liter with glucose level 159 Elevated AST at 217, ALT 101. Ammonia negative elevation. Lactic acid 3.77. Urinalysis noted large hemoglobin with positive nitrates and leukocytes. CT head negative for acute intracranial abnormalities-chronic bilateral infarcts noted. Chest x-ray no acute cardio pulmonary disease identified. Upon arrival to the emergency department patient's blood pressure was low, 91/22. IV peripheral line placed without fluids starting. Initial CBG 41-dextrose administered via IV. Type and screen performed-preparing for blood transfusion - 4 units ordered.  3:24 PM Patient started to have decreased respirations and started to have issues with breathing. Radial pulses were  unable to be palpated. Attending physician, Dr. Kirtland BouchardK. Ward at bedside. Attending spoke with the family in great detail. Patient made DNR. Family agreed to comfort care.   3:31 PM This provider spoke with Dr. Elvera LennoxGherghe, [facility] - discussed case, labs, ED course in great detail. Patient to be admitted for comfort care.   4:19 PM This provider and attending physician were made aware that the patient's family have changed their minds and reported that they want full code back in status. CCM to be consulted. Full Code status rebooted and to be started.   4:52 PM CCM spoke with this provider, Dr. Gerilyn PilgrimJacob - discussed case in great detail. Physician to come and assess patient. Notified that family agreed to blood and IV fluids. Patient back to DNR status.   5:13 PM Attending physician spoke with Dr. Elvera LennoxGherghe - new admission orders need to be placed.   5:33 PM This provider spoke with Dr. Randol KernElgergawy, [facility] in great detail, labs, vitals, ED course. [facility]. Agreed with fluids and blood transfusion. Family continues to agree with IV fluids and blood transfusion - patient is DNR status.   Patient is a chronically ill patient presenting to the ED with AMS - last seen normal yesterday. Initial CT head negative acute abnormalities noted. Chest xray negative for infection. UTI noted on specimen. Hgb 4.9 - patient to be transfused. Low blood pressures - IV placed with continuous IV fluids administered - patient perked up minimally while in the ED setting. Patient's blood pressure  continues to be of low, last blood pressure reading was 70/53 with a pulse ox of 92% on 2 L/m via nasal cannula. CKD identified - suspicion to be leading to elevated troponin levels. Patient seen and assessed by CCM - recommended IM admission since patient is DNR. Please see CCM, admitting physician and attending physician note for further information.    Raymon Mutton, PA-C 09/09/14 1910  Raymon Mutton, PA-C 09/09/14 2008

## 2014-09-09 NOTE — Progress Notes (Signed)
Chaplain checked in with pt's daughter Samuella Cotaandora. [name] says it is very hard to "watch my dad die." She says she has prayed to God for strength because "I'm not strong right now." Chaplain normalized these feelings of not being strong and provided grief support. Chaplain explored [name]'s spiritual needs and resources regarding leaning on God in this difficult time. [name] is planning to go home for a while to take care of herself. Chaplain will recommend follow-up to spiritual care staff.    09/09/14 2000  Clinical Encounter Type  Visited With Family  Visit Type Follow-up;Spiritual support  Spiritual Encounters  Spiritual Needs Grief support;Emotional  Stress Factors  Family Stress Factors Exhausted;Loss   Wille GlaserMcCray, Krystelle Prashad O, Chaplain 09/09/2014 8:04 PM

## 2014-09-09 NOTE — ED Notes (Signed)
Continues to be bagged-- resp effort minimal.

## 2014-09-09 NOTE — ED Notes (Signed)
Chem 8 results given to the charge nurse, Shanda BumpsJessica

## 2014-09-09 NOTE — ED Notes (Signed)
Lactic acid results given to [place], PA-C

## 2014-09-09 NOTE — ED Notes (Signed)
Blood consent signed by pt's daughter

## 2014-09-09 NOTE — ED Notes (Signed)
Pt placed on comfort care-family at bedside.

## 2014-09-09 NOTE — ED Notes (Signed)
Critical Care MD at bedside talking with family and assessing pt.

## 2014-09-09 NOTE — ED Notes (Addendum)
Family states that pt has not be as alert in the past couple days, normally can carry on a conversation. On arrival, pt is responsive to voice, pale mucus membranes , has IV in right hand per EMS. Family with pt.

## 2014-09-09 NOTE — ED Notes (Signed)
Returned from CT.

## 2014-09-10 ENCOUNTER — Encounter (HOSPITAL_COMMUNITY): Payer: Self-pay | Admitting: Physician Assistant

## 2014-09-10 DIAGNOSIS — R40243 Glasgow coma scale score 3-8: Secondary | ICD-10-CM

## 2014-09-10 DIAGNOSIS — D62 Acute posthemorrhagic anemia: Secondary | ICD-10-CM

## 2014-09-10 DIAGNOSIS — G9340 Encephalopathy, unspecified: Secondary | ICD-10-CM

## 2014-09-10 DIAGNOSIS — N179 Acute kidney failure, unspecified: Secondary | ICD-10-CM

## 2014-09-10 DIAGNOSIS — R195 Other fecal abnormalities: Secondary | ICD-10-CM

## 2014-09-10 DIAGNOSIS — N170 Acute kidney failure with tubular necrosis: Secondary | ICD-10-CM

## 2014-09-10 DIAGNOSIS — D649 Anemia, unspecified: Secondary | ICD-10-CM

## 2014-09-10 LAB — GLUCOSE, CAPILLARY
GLUCOSE-CAPILLARY: 139 mg/dL — AB (ref 70–99)
GLUCOSE-CAPILLARY: 145 mg/dL — AB (ref 70–99)
GLUCOSE-CAPILLARY: 224 mg/dL — AB (ref 70–99)
Glucose-Capillary: 19 mg/dL — CL (ref 70–99)
Glucose-Capillary: 56 mg/dL — ABNORMAL LOW (ref 70–99)
Glucose-Capillary: 95 mg/dL (ref 70–99)
Glucose-Capillary: <10 mg/dL — CL (ref 70–99)

## 2014-09-10 MED ORDER — CETYLPYRIDINIUM CHLORIDE 0.05 % MT LIQD
7.0000 mL | Freq: Two times a day (BID) | OROMUCOSAL | Status: DC
  Administered 2014-09-10 – 2014-09-16 (×12): 7 mL via OROMUCOSAL

## 2014-09-10 MED ORDER — DEXTROSE 50 % IV SOLN
25.0000 g | INTRAVENOUS | Status: DC | PRN
  Administered 2014-09-10 – 2014-09-11 (×2): 25 g via INTRAVENOUS
  Filled 2014-09-10: qty 50

## 2014-09-10 MED ORDER — DEXTROSE 50 % IV SOLN: 25.0000 mL | Freq: Once | INTRAVENOUS | Status: DC | PRN

## 2014-09-10 MED ORDER — ENSURE PUDDING PO PUDG
1.0000 | Freq: Two times a day (BID) | ORAL | Status: DC
  Administered 2014-09-11 – 2014-09-15 (×4): 1 via ORAL

## 2014-09-10 MED ORDER — ENSURE COMPLETE PO LIQD
237.0000 mL | Freq: Two times a day (BID) | ORAL | Status: DC
  Administered 2014-09-10 – 2014-09-16 (×8): 237 mL via ORAL

## 2014-09-10 MED ORDER — PANTOPRAZOLE SODIUM 40 MG PO TBEC
40.0000 mg | DELAYED_RELEASE_TABLET | Freq: Every day | ORAL | Status: DC
  Administered 2014-09-11 – 2014-09-16 (×6): 40 mg via ORAL
  Filled 2014-09-10 (×5): qty 1

## 2014-09-10 MED ORDER — DEXTROSE 50 % IV SOLN
INTRAVENOUS | Status: AC
  Administered 2014-09-10: 25 mL
  Filled 2014-09-10: qty 50

## 2014-09-10 MED ORDER — GLUCAGON HCL RDNA (DIAGNOSTIC) 1 MG IJ SOLR: 1.0000 mg | Freq: Once | INTRAMUSCULAR | Status: DC | PRN

## 2014-09-10 MED ORDER — CYANOCOBALAMIN 1000 MCG/ML IJ SOLN
1000.0000 ug | INTRAMUSCULAR | Status: DC
  Administered 2014-09-10: 1000 ug via INTRAMUSCULAR
  Filled 2014-09-10: qty 1

## 2014-09-10 NOTE — Progress Notes (Signed)
[facility] Step[facility] / ICU Progress Note  [name] JYN:[id number] Kevin PentaDOB: 10-30-1935 DOA: 09/09/2014 PCP: Kevin GermanAVBUERE,EDWIN A, Snow  Brief narrative: 79 year old male patient with history of diabetes, anemia of CKD, hypertension, coronary artery disease, prior strokes, and prior GI bleeding with last endoscopy in 2010 at which point he underwent ablation of AVMs, and was found to have gastritis, duodenitis and benign polyps. He was admitted in September 2014 with symptomatic anemia noting hemoglobin was 6.4 at presentation. He required transfusion. Hemoccult was negative at that admission. He presented to the ER with altered mentation with lethargy.  In the ER his hemoglobin was 4.9. He had a mildly elevated troponin and hypernatremia of 155. In addition he was Hemoccult positive. Upon arrival to the emergency department the patient's clinical status was very tenuous. He was unable to protect his airways and critical care medicine was consulted. After extensive discussion with the family at the bedside the patient was made a DO NOT RESUSCITATE. Family did not wish any further aggressive measures but were okay with continuing with blood transfusions, fluids and antibiotics. They requested no central lines, pressors or antiarrhythmics drugs. They also wished to limit needlesticks if possible.  Since admission patient has undergone CT of the head which was unremarkable for any acute intracranial changes. He has had issues with episodic hypoglycemia noting he was on Actos prior to admission. This has required infusion of dextrose fluids as well as periodic boluses with D50. Patient's mentation has improved as of 10/28 and he was awake enough to tell he was thirsty and requested Ensure. He subsequently drank an entire bottle of strawberry flavored Ensure. Since admission his urinalysis appears consistent with the urinary tract infection and had been started on empiric Rocephin.  HPI/Subjective: Awake  and complaining of being thirsty requesting water and ensure. No apparent chest pain or shortness of breath.  Assessment/Plan:  Acute blood loss anemia S/p 4U PRBC - follow-up CBC pending - we'll check again in a.m.  Hemorrhagic shock Resolving after transfusion  GI bleed Hemoccult positive at presentation without signs of obvious active bleeding - GI feel endoscopic evaluation inappropriate - suspect etiology likely AVMs - allow diet as tolerated  End of life care Had an additional discussion with patient's daughter [name] on 10/28 who wishes to continue with current plan - she is aware that although her father has improved somewhat but his condition remains tenuous and he still may not survive this hospitalization.  Acute respiratory failure with hypoxia Initial chest x-ray unremarkable and suspect hypoxemia primarily related to altered mentation and hypoventilation  Acute renal failure Baseline renal function normal in September with a BUN of 17 and a creatinine of 0.69 - at presentation BUN 55 and creatinine 1.44 - related to acute blood loss as well as low perfusion and hypotension - can follow electrolytes panel periodically given the fact that family wishes to minimize blood draws  Abnormal urinalysis Appears consistent with UTI - continue empiric Rocephin  Elevated troponin Secondary to demand ischemia from acute blood loss anemia with associated hypoperfusion and hypotension - will not cycle any further given family's preference to avoid aggressive interventions  Diabetes mellitus Recent issues with hypoglycemia at presentation and since admission - was on Actos prior to admission and suspect acute renal failure with poor clearance of this medication contributing - continue IV fluids as tolerates noting poor IV access and limited lines - patient now tolerating diet with noted increase in CBGs   History of CVA  Recent CT of the head at admission consistent with prior  CVA  Acute encephalopathy Multifactorial due to anemia with hypotension, acute renal failure, hypoglycemia and urinary tract infection  DVT prophylaxis: SCDs Code Status: DO NOT RESUSCITATE Family Communication: Extensive telephone conversation with patient's daughter [name] Disposition Plan/Expected LOS: Stepdown  Consultants: Gastroenterology  Procedures: None  Antibiotics: Rocephin 10/27 >  Objective: Blood pressure 116/59, pulse 61, temperature 97.5 F (36.4 C), temperature source Axillary, resp. rate 16, height 4' (1.219 m), weight 42 kg (92 lb 9.5 oz), SpO2 100.00%.  Intake/Output Summary (Last 24 hours) at 09/10/14 1753 Last data filed at 09/10/14 1714  Gross per 24 hour  Intake   2627 ml  Output    475 ml  Net   2152 ml    Exam: Gen: No acute respiratory distress-now awake and verbally communicating Chest: Clear to auscultation bilaterally without wheezes, rhonchi or crackles, 3 L Cardiac: Regular rate and rhythm, no rubs murmurs or gallops Abdomen: Soft nontender nondistended without obvious hepatosplenomegaly, no ascites Extremities: no signif cyanosis, clubbing, or edema B LE   Scheduled Meds:  Scheduled Meds: . sodium chloride  10 mL/hr Intravenous Once  . antiseptic oral rinse  7 mL Mouth Rinse BID  . cefTRIAXone (ROCEPHIN)  IV  1 g Intravenous Q24H  . feeding supplement (ENSURE COMPLETE)  237 mL Oral BID BM   Or  . feeding supplement (ENSURE)  1 Container Oral BID BM  .  morphine injection  4 mg Intravenous Once  . [START ON 09/11/2014] pantoprazole  40 mg Oral Daily  . sodium chloride  3 mL Intravenous Q12H    Data Reviewed: Basic Metabolic Panel:  Recent Labs Lab 09/09/14 1429 09/09/14 1500  NA 157* 155*  K 3.8 3.5*  CL 116* 117*  CO2 23  --   GLUCOSE 159* 157*  BUN 55* 57*  CREATININE 1.44* 1.60*  CALCIUM 9.1  --    Liver Function Tests:  Recent Labs Lab 09/09/14 1429  AST 217*  ALT 101*  ALKPHOS 57  BILITOT 0.7  PROT 6.3   ALBUMIN 2.8*    Recent Labs Lab 09/09/14 1429  AMMONIA 22   CBC:  Recent Labs Lab 09/09/14 1429 09/09/14 1500 09/10/14 1618  WBC 12.4*  --  14.1*  NEUTROABS 11.0*  --   --   HGB 4.9* 5.1* 12.0*  HCT 15.9* 15.0* 35.8*  MCV 97.5  --  REPEATED TO VERIFY  PLT 160  --  85*   Cardiac Enzymes:  Recent Labs Lab 09/09/14 1429  TROPONINI 0.59*   CBG:  Recent Labs Lab 09/09/14 1454 09/10/14 0914 09/10/14 0918 09/10/14 0951 09/10/14 1148  GLUCAP 41* 19* 56* 95 139*    Recent Results (from the past 240 hour(s))  CULTURE, BLOOD (ROUTINE X 2)     Status: None   Collection Time    09/09/14  4:00 PM      Result Value Ref Range Status   Specimen Description BLOOD LEFT ARM   Final   Special Requests BOTTLES DRAWN AEROBIC ONLY 5CC   Final   Culture  Setup Time     Final   Value: 09/09/2014 22:39     Performed at [facility]   Culture     Final   Value:        BLOOD CULTURE RECEIVED NO GROWTH TO DATE CULTURE WILL BE HELD FOR 5 DAYS BEFORE ISSUING A FINAL NEGATIVE REPORT     Performed at [facility]  Report Status PENDING   Incomplete  CULTURE, BLOOD (ROUTINE X 2)     Status: None   Collection Time    09/09/14  4:30 PM      Result Value Ref Range Status   Specimen Description BLOOD LEFT HAND   Final   Special Requests BOTTLES DRAWN AEROBIC AND ANAEROBIC 5CC   Final   Culture  Setup Time     Final   Value: 09/09/2014 22:32     Performed at [facility]   Culture     Final   Value:        BLOOD CULTURE RECEIVED NO GROWTH TO DATE CULTURE WILL BE HELD FOR 5 DAYS BEFORE ISSUING A FINAL NEGATIVE REPORT     Performed at [facility]   Report Status PENDING   Incomplete  MRSA PCR SCREENING     Status: None   Collection Time    09/09/14  8:30 PM      Result Value Ref Range Status   MRSA by PCR NEGATIVE  NEGATIVE Final   Comment:            The GeneXpert MRSA Assay (FDA     approved for NASAL specimens     only), is one component of a      comprehensive MRSA colonization     surveillance program. It is not     intended to diagnose MRSA     infection nor to guide or     monitor treatment for     MRSA infections.     Studies:  Recent x-ray studies have been reviewed in detail by the Attending Physician  Time spent :  35 mins  Junious Silk, ANP [facility] Office  [phone] Pager [phone]   **If unable to reach the above provider after paging please contact the Flow Manager @ [phone]  On-Call/Text Page:      [email or web address]      password [email or web address]  If 7PM-7AM, please contact night-coverage [email or web address] Password [email or web address] 09/10/2014, 5:53 PM   LOS: 1 day   I have personally examined this patient and reviewed the entire database. I have reviewed the above note, made any necessary editorial changes, and agree with its content.  Lonia Blood, Snow [facility]

## 2014-09-10 NOTE — Progress Notes (Signed)
Chaplain received consult to visit with [name], patient was sleeping when arrived. Will return later.  Gala RomneyBrown, Sharla Tankard J, Chaplain 09/10/2014

## 2014-09-10 NOTE — Care Management Note (Addendum)
    Page 1 of 2   09/16/2014     11:06:21 AM CARE MANAGEMENT NOTE 09/16/2014  Patient:  [name],[name]   Account Number:  [id number]  Date Initiated:  09/10/2014  Documentation initiated by:  MAYO,HENRIETTA  Subjective/Objective Assessment:   dx GI Bleed; lives with dtr  patient has caps services in place.  PCP  Fleet ContrasEdwin Avbuere     Action/Plan:   Home with Hospice [facility]   Anticipated DC Date:  09/16/2014   Anticipated DC Plan:  HOME W HOME HEALTH SERVICES  In-house referral  Clinical Social Worker      DC Planning Services  CM consult      [facility]   Choice offered to / List presented to:  C-4 Adult Children        HH arranged  HH-1 RN      [facility]   Status of service:  Completed, signed off Medicare Important Message given?  YES (If response is "NO", the following Medicare IM given date fields will be blank) Date Medicare IM given:  09/12/2014 Medicare IM given by:  Letha CapeAYLOR,Kobie Whidby Date Additional Medicare IM given:  09/15/2014 Additional Medicare IM given by:  Letha CapeEBORAH Griselda Tosh  Discharge Disposition:  HOME W HOSPICE CARE  Per UR Regulation:  Reviewed for med. necessity/level of care/duration of stay  If discussed at Long Length of Stay Meetings, dates discussed:    Comments:  09/16/14 1026 Letha Capeeborah Dusty Wagoner RN, BSN [phone] patient is for dc today, NCM spoke with [name] , the patient's daughter, informed her patient is for dc today and she decided [facility], [facility], soc will begin 24-48 hrs post dc,  patient [facility] a [facility]. Patient will need ambulance transport, NCM verified address and informed CSW they would like ambulance to pick patient up at 1 pm. today.  09/12/14 1632 Letha Capeeborah Adream Parzych RN, BSN [phone] NCM spoke with daughter Samuella Cotaandora and they chose [facility], [facility], Margie notified.  Patient has a hospital bed, a hoyer  lift, a w/chair.  Patient is on oxygen here at hospital but not at home, so he will probably need home oxygen for comfort.  Patient will need ambulance transport when dc tomorrow to home. CSW aware.  09/11/14 1200 Henrietta Mayo RN MSN BSN CCM Per attending, pt is a candidate for residential hospice. CSW notified and will offer choice.

## 2014-09-10 NOTE — Progress Notes (Signed)
Hypoglycemic Event  CBG: 56  Treatment: D50 IV 25 mL  Symptoms: None  Follow-up CBG: Time:0951 CBG Result: 95  Possible Reasons for Event: NPO  Comments/MD notified: [name] (NP) was on unit and was made aware    [name], [name]  Remember to initiate Hypoglycemia Order Set & complete

## 2014-09-10 NOTE — Progress Notes (Signed)
Chaplain made initial visit with pt and family, 2 Sisters were present Clinical biochemistngraham and [name], along with Pt's grandchildren. During my conversation with Ms. Kevin Cotaandora, she stressed that this has been difficult for her and watching it unfold is sapping her strength. She expressed that the pt didn't look like himself yesterday and today she believes that "God is giving him strength so we can see him as he was" before he departs. [name]'s hope for her father are that he recovers but expressed she is not pursuing aggressive measures. Pt's teenage granddaughters are using humor to ease the pain of what they are going through, but expressed it is tough for them. [name] asked for prayer for strength to endure as she is expecting her brother to pass anytime.   Will check-in with family later as per pt's request.   [name], [name], Chaplain 09/10/2014

## 2014-09-10 NOTE — Progress Notes (Signed)
Inpatient Diabetes Program Recommendations  AACE/ADA: New Consensus Statement on Inpatient Glycemic Control (2013)  Target Ranges:  Prepandial:   less than 140 mg/dL      Peak postprandial:   less than 180 mg/dL (1-2 hours)      Critically ill patients:  140 - 180 mg/dL   Results for Jeris PentaLITTLE, Nethaniel (MRN [id number]) as of 09/10/2014 08:43  Ref. Range 09/09/2014 14:54  Glucose-Capillary Latest Range: 70-99 mg/dL 41 (LL)  Results for Jeris PentaLITTLE, Krishna (MRN [id number]) as of 09/10/2014 08:43  Ref. Range 09/09/2014 14:29 09/09/2014 15:00  Glucose Latest Range: 70-99 mg/dL 865159 (H) 784157 (H)   Diabetes history: DM2 Outpatient Diabetes medications: Actos 45 mg daily Current orders for Inpatient glycemic control: None  Inpatient Diabetes Program Recommendations Correction (SSI): Please order CBGs with Novolog correction scale Q4H.   Thanks,  Orlando PennerMarie Hannan Tetzlaff, RN, MSN, CCRN Diabetes Coordinator Inpatient Diabetes Program [phone] (Team Pager) [phone] (AP office) [phone] [facility] office)

## 2014-09-10 NOTE — Progress Notes (Signed)
PULMONARY / CRITICAL CARE MEDICINE   Name: [name] MRN: [id number] DOB: [birth year]    ADMISSION DATE:  09/09/2014 CONSULTATION DATE:  09/09/2014  REFERRING MD :  EDP  CHIEF COMPLAINT:  AMS, respiratory failure and hypovolemic shock  INITIAL PRESENTATION: 79 year old poorly controlled diabetic bilateral lower ext amputee who lives with his daughter.  For the past two days daughter has noticed that patient has been deteriorating from a mental status standpoint.  Worsening also has been his inability to maintain PO intake.  Younger daughter came to visit on the day of presentation and noted that the patient is having worsening SOB, some chest pain and AMS.  EMS was called and patient was brought to the ER.  Originally patient was DNR and was going to be admitted for comfort measures.  However, family changed their mind and patient was changed back to full code and PCCM was called to admit.  STUDIES:  None  SIGNIFICANT EVENTS: 10/27 admission to the hospital  SUBJECTIVE: no events, remains unresponsive  VITAL SIGNS: Temp:  [97.6 F (36.4 C)-99.8 F (37.7 C)] 98.1 F (36.7 C) (10/28 0515) Pulse Rate:  [26-109] 55 (10/28 0600) Resp:  [8-27] 11 (10/28 0600) BP: (70-132)/(16-97) 132/64 mmHg (10/28 0600) SpO2:  [89 %-100 %] 89 % (10/28 0600) Weight:  [42 kg (92 lb 9.5 oz)-46.72 kg (103 lb)] 42 kg (92 lb 9.5 oz) (10/28 0412) HEMODYNAMICS:   VENTILATOR SETTINGS:   INTAKE / OUTPUT:  Intake/Output Summary (Last 24 hours) at 09/10/14 0826 Last data filed at 09/10/14 0504  Gross per 24 hour  Intake   2390 ml  Output    175 ml  Net   2215 ml    PHYSICAL EXAMINATION: General:  Chronically ill appearing male, unresponsive, low RR. Neuro:  Unresponsive, spontaneously moving upper ext but not lower (bilateral lower ext amputee however). HEENT:  [place]/AT, PERRL, EOM-spontaneous and DMM. Cardiovascular:  IRIR, Nl S1/S2, -M/R/G. Lungs:  Decrease BS diffusely. Abdomen:  Soft, NT, ND  and +BS. Musculoskeletal:  Bilateral lower ext amputee. Skin:  Multiple vein puncture sites that are poorly healing.  LABS:  CBC  Recent Labs Lab 09/09/14 1429 09/09/14 1500  WBC 12.4*  --   HGB 4.9* 5.1*  HCT 15.9* 15.0*  PLT 160  --    Coag's No results found for this basename: APTT, INR,  in the last 168 hours BMET  Recent Labs Lab 09/09/14 1429 09/09/14 1500  NA 157* 155*  K 3.8 3.5*  CL 116* 117*  CO2 23  --   BUN 55* 57*  CREATININE 1.44* 1.60*  GLUCOSE 159* 157*   Electrolytes  Recent Labs Lab 09/09/14 1429  CALCIUM 9.1   Sepsis Markers  Recent Labs Lab 09/09/14 1445  LATICACIDVEN 3.77*   ABG No results found for this basename: PHART, PCO2ART, PO2ART,  in the last 168 hours Liver Enzymes  Recent Labs Lab 09/09/14 1429  AST 217*  ALT 101*  ALKPHOS 57  BILITOT 0.7  ALBUMIN 2.8*   Cardiac Enzymes  Recent Labs Lab 09/09/14 1429  TROPONINI 0.59*   Glucose  Recent Labs Lab 09/09/14 1454  GLUCAP 41*    Imaging Dg Chest 1 View  09/09/2014   CLINICAL DATA:  Altered mental status.  EXAM: CHEST - 1 VIEW  COMPARISON:  July 22, 2014.  FINDINGS: The heart size and mediastinal contours are within normal limits. Both lungs are clear. No pneumothorax or pleural effusion is noted. Degenerative changes seen involving the left  glenohumeral joint with subacromial narrowing suggesting rotator cuff injury.  IMPRESSION: No acute cardiopulmonary abnormality seen.   Electronically Signed   By: Roque LiasJames  Green M.D.   On: 09/09/2014 16:56   Ct Head Wo Contrast  09/09/2014   CLINICAL DATA:  Progressive worsening of mental status.  EXAM: CT HEAD WITHOUT CONTRAST  TECHNIQUE: Contiguous axial images were obtained from the base of the skull through the vertex without intravenous contrast.  COMPARISON:  07/20/2014  FINDINGS: There is a large area of encephalomalacia involving the right parietal lobe and right occipital lobe compatible with previous right MCA  infarct. Chronic left cerebellar infarct is also identified. There is prominence of the sulci and ventricles compatible with brain atrophy. There is no evidence for acute brain infarct, hemorrhage or intracranial mass. No abnormal extra-axial fluid collections identified. The paranasal sinuses appear clear. The mastoid air cells are also clear. The calvarium is intact.  IMPRESSION: 1. No acute intracranial abnormalities. 2. Chronic bilateral infarcts.   Electronically Signed   By: Signa Kellaylor  Stroud M.D.   On: 09/09/2014 18:16     ASSESSMENT / PLAN:  PULMONARY OETT N/A A: Respiratory failure likely developing due to AMS stemming from hypotension vs CVA, see below. P:   - Patient is DNR. - No intubation. - Supplemental O2. - Unlikely to be protecting his airway for long.  CARDIOVASCULAR CVL N/A A: Likely cardiogenic vs hypovolemic shock. P:  - Fluid resuscitate. - Will defer echo to primary.  RENAL A:  ARF.  Hypernatremia.  Hyperchloremia. P:   - Hydrate. - Replace electrolytes as indicated. - Monitor BMET  GASTROINTESTINAL A:  ?GI bleed. P:   - NPO, ?NGT and TF. - Protonix.  HEMATOLOGIC A:  Low Hg, ?GI bleeding. P:  - Transfuse.  INFECTIOUS A:  No evidence of infection. P:   - Defer cultures and abx for now.  ENDOCRINE A:  DM by history.  ?low cortisol.   P:   - Check cortisol level. - Stress dose steroids. - CBGs and ISS.  NEUROLOGIC A:  AMS ?due to hypotension vs primary CVA. P:   - No sedating agents. - Head CT noted. - Maintain BP with IVF.  FAMILY  - Currently no family bedside, patient remains unresponsive, further transfusion today, remains DNR, PCCM will sign off, please call back if needed.  I have personally obtained a history, examined the patient, evaluated laboratory and imaging results, formulated the assessment and plan and placed orders.  Alyson ReedyWesam G. Michon Kaczmarek, M.D. [facility] Medicine. Pager: [phone]. After hours pager:  [phone].  09/10/2014, 8:26 AM
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
## 2014-09-11 DIAGNOSIS — R7989 Other specified abnormal findings of blood chemistry: Secondary | ICD-10-CM

## 2014-09-11 DIAGNOSIS — K922 Gastrointestinal hemorrhage, unspecified: Secondary | ICD-10-CM

## 2014-09-11 LAB — COMPREHENSIVE METABOLIC PANEL
ALBUMIN: 2.2 g/dL — AB (ref 3.5–5.2)
ALK PHOS: 82 U/L (ref 39–117)
ALT: 101 U/L — ABNORMAL HIGH (ref 0–53)
AST: 192 U/L — ABNORMAL HIGH (ref 0–37)
Anion gap: 14 (ref 5–15)
BUN: 51 mg/dL — AB (ref 6–23)
CO2: 22 mEq/L (ref 19–32)
CREATININE: 1.31 mg/dL (ref 0.50–1.35)
Calcium: 8.1 mg/dL — ABNORMAL LOW (ref 8.4–10.5)
Chloride: 116 mEq/L — ABNORMAL HIGH (ref 96–112)
GFR calc non Af Amer: 51 mL/min — ABNORMAL LOW (ref >90)
GFR, EST AFRICAN AMERICAN: 59 mL/min — AB (ref >90)
GLUCOSE: 176 mg/dL — AB (ref 70–99)
Potassium: 3.3 mEq/L — ABNORMAL LOW (ref 3.7–5.3)
Sodium: 152 mEq/L — ABNORMAL HIGH (ref 137–147)
TOTAL PROTEIN: 5.2 g/dL — AB (ref 6.0–8.3)
Total Bilirubin: 0.5 mg/dL (ref 0.3–1.2)

## 2014-09-11 LAB — GLUCOSE, CAPILLARY
GLUCOSE-CAPILLARY: 112 mg/dL — AB (ref 70–99)
GLUCOSE-CAPILLARY: 156 mg/dL — AB (ref 70–99)
GLUCOSE-CAPILLARY: 205 mg/dL — AB (ref 70–99)
GLUCOSE-CAPILLARY: 54 mg/dL — AB (ref 70–99)
Glucose-Capillary: 164 mg/dL — ABNORMAL HIGH (ref 70–99)
Glucose-Capillary: 32 mg/dL — CL (ref 70–99)

## 2014-09-11 LAB — TYPE AND SCREEN
ABO/RH(D): O POS
Antibody Screen: NEGATIVE
UNIT DIVISION: 0
UNIT DIVISION: 0
Unit division: 0
Unit division: 0

## 2014-09-11 LAB — CBC
HEMATOCRIT: 35.8 % — AB (ref 39.0–52.0)
HEMATOCRIT: 34.1 % — AB (ref 39.0–52.0)
HEMOGLOBIN: 12 g/dL — AB (ref 13.0–17.0)
HEMOGLOBIN: 11.5 g/dL — AB (ref 13.0–17.0)
MCH: 29.5 pg (ref 26.0–34.0)
MCH: 29.9 pg (ref 26.0–34.0)
MCHC: 33.5 g/dL (ref 30.0–36.0)
MCHC: 33.7 g/dL (ref 30.0–36.0)
MCV: 88.6 fL (ref 78.0–100.0)
Platelets: 85 10*3/uL — ABNORMAL LOW (ref 150–400)
Platelets: 81 10*3/uL — ABNORMAL LOW (ref 150–400)
RBC: 4.07 MIL/uL — AB (ref 4.22–5.81)
RBC: 3.85 MIL/uL — ABNORMAL LOW (ref 4.22–5.81)
RDW: 17 % — ABNORMAL HIGH (ref 11.5–15.5)
RDW: 17.5 % — AB (ref 11.5–15.5)
WBC: 14.1 10*3/uL — AB (ref 4.0–10.5)
WBC: 11.9 10*3/uL — ABNORMAL HIGH (ref 4.0–10.5)

## 2014-09-11 MED ORDER — DEXTROSE 50 % IV SOLN
INTRAVENOUS | Status: AC
  Administered 2014-09-11: 25 g via INTRAVENOUS
  Filled 2014-09-11: qty 50

## 2014-09-11 MED ORDER — POTASSIUM CHLORIDE CRYS ER 20 MEQ PO TBCR
40.0000 meq | EXTENDED_RELEASE_TABLET | Freq: Once | ORAL | Status: AC
  Administered 2014-09-11: 40 meq via ORAL
  Filled 2014-09-11: qty 2
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
## 2014-09-11 NOTE — Progress Notes (Signed)
[name]  [name] BJY:[id number] DOB: [birth year] DOA: 09/09/2014 PCP: Dorrene German, MD  Brief narrative: 79 year old male patient with history of diabetes, anemia of CKD, hypertension, coronary artery disease, prior strokes, and prior GI bleeding with last endoscopy in 2010 at which point he underwent ablation of AVMs, and was found to have gastritis, duodenitis and benign polyps. He was admitted in September 2014 with symptomatic anemia noting hemoglobin was 6.4 at presentation. He required transfusion. Hemoccult was negative at that admission. He presented to the ER with altered mentation with lethargy.  In the ER his hemoglobin was 4.9. He had a mildly elevated troponin and hypernatremia of 155. In addition he was Hemoccult positive. Upon arrival to the emergency department the patient's clinical status was very tenuous. He was unable to protect his airways and critical care medicine was consulted. After extensive discussion with the family at the bedside the patient was made a DO NOT RESUSCITATE. Family did not wish any further aggressive measures but were okay with continuing with blood transfusions, fluids and antibiotics. They requested no central lines, pressors or antiarrhythmics drugs. They also wished to limit needlesticks if possible.  Since admission patient has undergone CT of the head which was unremarkable for any acute intracranial changes. He has had issues with episodic hypoglycemia noting he was on Actos prior to admission. This has required infusion of dextrose fluids as well as periodic boluses with D50. Patient's mentation has improved as of 10/28 and he was awake enough to tell he was thirsty and requested Ensure. He subsequently drank an entire bottle of strawberry flavored Ensure. Since admission his urinalysis appears consistent with the urinary tract infection and had been started on empiric Rocephin.  HPI/Subjective: Much  less alert today.  Assessment/Plan:  Acute blood loss anemia S/p 4U PRBC - follow-up CBC hgb stable at 11 and no signs of bleeding observed  Hemorrhagic shock/shock liver Resolving after transfusion -LFTS up from hypoperfusion- limiting labs for comfort reasons so will not follow closely  GI bleed Hemoccult positive at presentation without signs of obvious active bleeding - GI feel endoscopic evaluation inappropriate - suspect etiology likely AVMs - allow diet as tolerated  Thrombocytopenia Due to consumption and UTI-will not follow closely since comfort is focus  End of life care Had an additional discussion with patient's daughter [name] on 10/28 who wishes to continue with current plan - she is aware that although her father has improved somewhat his condition remains tenuous and he still may not survive this hospitalization. Continue focus based care but treat UTI -prognosis is really poor, may not he survive this hospitalization so will ask for residential hospice evaluation-needs MOST form completed before dc  Acute respiratory failure with hypoxia Initial chest x-ray unremarkable and suspect hypoxemia primarily related to altered mentation and hypoventilation-now stable on room air  Acute renal failure/hyponatremia/Dehydration Baseline renal function normal in September with a BUN of 17 and a creatinine of 0.69 - at presentation BUN 55 and creatinine 1.44 - related to acute blood loss as well as low perfusion and hypotension - can follow electrolytes panel periodically given the fact that family wishes to minimize blood draws-cont DN1/2NS fluids  E Coli UTI Continue empiric Rocephin- FU on final cx  Hypokalemia Oral replete but will not obtain FU lab unless clinical status improves markedly  Elevated troponin Secondary to demand ischemia from acute blood loss anemia with associated hypoperfusion and hypotension - will not cycle any  further given family's preference to avoid  aggressive interventions  Diabetes mellitus Recent issues with hypoglycemia at presentation and since admission - was on Actos prior to admission and suspect acute renal failure with poor clearance of this medication contributing - continue IV fluids as tolerates noting poor IV access and limited lines - patient was tolerating diet with noted increase in CBGs on 10/28 but less alert 10/29  History of CVA  Recent CT of the head at admission consistent with prior CVA  Acute encephalopathy Multifactorial due to anemia with hypotension, acute renal failure, hypoglycemia and urinary tract infection  DVT prophylaxis: SCDs Code Status: DO NOT RESUSCITATE Family Communication: Extensive telephone conversation with patient's daughter [name] Disposition Plan/Expected LOS: Transfer to floor- will give IVFs for now snf treat UTI- pt may not survive to dc but SW to explore residential Hospice placement  Consultants: Gastroenterology/Dr. Leone PayorGessner  Procedures: None  Antibiotics: Rocephin 10/27 >  Objective: Blood pressure 114/33, pulse 115, temperature 98.4 F (36.9 C), temperature source Axillary, resp. rate 11, height 4' (1.219 m), weight 92 lb 9.5 oz (42 kg), SpO2 72.00%.  Intake/Output Summary (Last 24 hours) at 09/11/14 0841 Last data filed at 09/11/14 0800  Gross per 24 hour  Intake    772 ml  Output    525 ml  Net    247 ml    Exam: Gen; [name] but in no respiratory distress HEENT: Bilateral scleral edema with injection Chest: Clear to auscultation bilaterally without wheezes, rhonchi or crackles, RA Cardiac: Regular rate and rhythm, no rubs murmurs or gallops Abdomen: Soft nontender nondistended without obvious hepatosplenomegaly, no ascites Extremities: no signif cyanosis, clubbing, or edema B LE   Scheduled Meds:  Scheduled Meds: . antiseptic oral rinse  7 mL Mouth Rinse BID  . cefTRIAXone (ROCEPHIN)  IV  1 g Intravenous Q24H  . cyanocobalamin  1,000 mcg Intramuscular  Q30 days  . feeding supplement (ENSURE COMPLETE)  237 mL Oral BID BM   Or  . feeding supplement (ENSURE)  1 Container Oral BID BM  . pantoprazole  40 mg Oral Daily  . potassium chloride  40 mEq Oral Once  . sodium chloride  3 mL Intravenous Q12H    Data Reviewed: Basic Metabolic Panel:  Recent Labs Lab 09/09/14 1429 09/09/14 1500 09/11/14 0550  NA 157* 155* 152*  K 3.8 3.5* 3.3*  CL 116* 117* 116*  CO2 23  --  22  GLUCOSE 159* 157* 176*  BUN 55* 57* 51*  CREATININE 1.44* 1.60* 1.31  CALCIUM 9.1  --  8.1*   Liver Function Tests:  Recent Labs Lab 09/09/14 1429 09/11/14 0550  AST 217* 192*  ALT 101* 101*  ALKPHOS 57 82  BILITOT 0.7 0.5  PROT 6.3 5.2*  ALBUMIN 2.8* 2.2*    Recent Labs Lab 09/09/14 1429  AMMONIA 22   CBC:  Recent Labs Lab 09/09/14 1429 09/09/14 1500 09/10/14 1618 09/11/14 0550  WBC 12.4*  --  14.1* 11.9*  NEUTROABS 11.0*  --   --   --   HGB 4.9* 5.1* 12.0* 11.5*  HCT 15.9* 15.0* 35.8* 34.1*  MCV 97.5  --  REPEATED TO VERIFY 88.6  PLT 160  --  85* 81*   Cardiac Enzymes:  Recent Labs Lab 09/09/14 1429  TROPONINI 0.59*   CBG:  Recent Labs Lab 09/10/14 1712 09/10/14 1953 09/10/14 2022 09/11/14 0034 09/11/14 0824  GLUCAP 145* <10* 224* 205* 164*    Recent Results (from the past 240 hour(s))  CULTURE, BLOOD (ROUTINE X 2)     Status: None   Collection Time    09/09/14  4:00 PM      Result Value Ref Range Status   Specimen Description BLOOD LEFT ARM   Final   Special Requests BOTTLES DRAWN AEROBIC ONLY 5CC   Final   Culture  Setup Time     Final   Value: 09/09/2014 22:39     Performed at [facility]   Culture     Final   Value:        BLOOD CULTURE RECEIVED NO GROWTH TO DATE CULTURE WILL BE HELD FOR 5 DAYS BEFORE ISSUING A FINAL NEGATIVE REPORT     Performed at [facility]   Report Status PENDING   Incomplete  CULTURE, BLOOD (ROUTINE X 2)     Status: None   Collection Time    09/09/14  4:30 PM       Result Value Ref Range Status   Specimen Description BLOOD LEFT HAND   Final   Special Requests BOTTLES DRAWN AEROBIC AND ANAEROBIC 5CC   Final   Culture  Setup Time     Final   Value: 09/09/2014 22:32     Performed at [facility]   Culture     Final   Value:        BLOOD CULTURE RECEIVED NO GROWTH TO DATE CULTURE WILL BE HELD FOR 5 DAYS BEFORE ISSUING A FINAL NEGATIVE REPORT     Performed at [facility]   Report Status PENDING   Incomplete  URINE CULTURE     Status: None   Collection Time    09/09/14  5:52 PM      Result Value Ref Range Status   Specimen Description URINE, CATHETERIZED   Final   Special Requests NONE   Final   Culture  Setup Time     Final   Value: 09/10/2014 00:18     Performed at [facility]   Colony Count     Final   Value: >=100,000 COLONIES/ML     Performed at [facility]   Culture     Final   Value: ESCHERICHIA COLI     Performed at [facility]   Report Status PENDING   Incomplete  MRSA PCR SCREENING     Status: None   Collection Time    09/09/14  8:30 PM      Result Value Ref Range Status   MRSA by PCR NEGATIVE  NEGATIVE Final   Comment:            The GeneXpert MRSA Assay (FDA     approved for NASAL specimens     only), is one component of a     comprehensive MRSA colonization     surveillance program. It is not     intended to diagnose MRSA     infection nor to guide or     monitor treatment for     MRSA infections.     Studies:  Recent x-ray studies have been reviewed in detail by the Attending Physician  Time spent :  35 mins  Junious Silkllison Ellis, ANP [facility] Office  [phone] Pager [phone]   **If unable to reach the above provider after paging please contact the Flow Manager @ [phone]  On-Call/Text Page:      [email or web address] [email or web address]      password [email or web address]  If 7PM-7AM, please contact night-coverage [email or web address] Password [email or web address] 09/11/2014, 8:41  AM   LOS: 2 days   Attending: Patient  was seen, examined,treatment plan was discussed with the  Advance Practice Provider.  I have directly reviewed the clinical findings, lab, imaging studies and management of this patient in detail. I have made the necessary changes to the above noted documentation, and agree with the documentation, as recorded by the Advance Practice Provider.   Unfortunate 79 year old gentleman with history of gastric AVMs admitted with severe anemia likely from acute blood loss from GI bleeding. Not a candidate for further endoscopic procedures, transfused PRBC, stable for transfer to floor, suspect may require  residential hospice on discharge. Social worker consulted  Windell Norfolk MD [facility].

## 2014-09-11 NOTE — Progress Notes (Signed)
Report received troyce RN from 2S for patient to be transferred into 5w10

## 2014-09-11 NOTE — Progress Notes (Signed)
Hypoglycemic Event  CBG: 32  Treatment: D50 IV 50 mL  Symptoms: None  Follow-up CBG: Time:2310 CBG Result:54  Possible Reasons for Event: Inadequate meal intake  Comments/MD notified:[name], NP    [name], [name]  Remember to initiate Hypoglycemia Order Set & complete

## 2014-09-11 NOTE — Progress Notes (Signed)
NURSING PROGRESS NOTE  [name] [id number] Transfer Data: 09/11/2014 7:49 PM Attending Provider: Maretta BeesShanker M Ghimire, MD BJY:[id number],ZHYQMPCP:AVBUERE,EDWIN A, MD Code Status: FULL   [name] is a 79 y.o. male patient transferred from 2C -No acute distress noted.  -No complaints of shortness of breath.  -No complaints of chest pain.    Blood pressure 146/59, pulse 68, temperature 98.4 F (36.9 C), temperature source Axillary, resp. rate 14, height 4' (1.219 m), weight 42 kg (92 lb 9.5 oz), SpO2 99.00%.   IV Fluids: Two peripheral IV's noted to left forearm intact with no phlebitis or infiltration noted   Allergies:  Review of patient's allergies indicates no known allergies.  Past Medical History:   has a past medical history of Diabetes mellitus; Hypertension; Myocardial infarction (2005); Blood clot in vein; Blindness temporary; Hyperlipidemia; CAD (coronary artery disease); Stroke (10/2008); Protein calorie malnutrition (2011); Atrial fibrillation (2010); Gallstones (11/2010); Compression fracture (11/2010); COPD (chronic obstructive pulmonary disease); and Shortness of breath.  Past Surgical History:   has past surgical history that includes vein bypass graft,aorto-fem-pop; Above knee leg amputaton (Right); Hip fracture surgery (Left, 2010); internal carotid angioplasty (Right, 2010); Thoracotomy; Transmetatarsal amputation (Left); and common iliac stent (Bilateral).   Skin: stage 2 to sacrum. Wound noted to penis. Bilateral   Patient orientated to room. Information packet given to patient. Admission inpatient armband information verified with patient to include name and date of birth and placed on patient arm. Side rails up x 2, fall assessment and education completed with patient. Patient able to verbalize understanding of risk associated with falls and verbalized understanding to call for assistance before getting out of bed. Call light within reach. Patient able to voice and demonstrate  understanding of unit orientation instructions.    Will continue to evaluate and treat per MD orders.  Cathlyn Parsonsattha Jule Whitsel, RN

## 2014-09-11 NOTE — Progress Notes (Signed)
Called report to [facility] spoke with Black & DeckerLinda RN. Pt will not need tele.

## 2014-09-11 NOTE — Progress Notes (Signed)
Transferred to 5 west bed 10 per bed. Accompanied by RN and Nurse tech

## 2014-09-12 DIAGNOSIS — J9601 Acute respiratory failure with hypoxia: Secondary | ICD-10-CM

## 2014-09-12 DIAGNOSIS — D519 Vitamin B12 deficiency anemia, unspecified: Secondary | ICD-10-CM

## 2014-09-12 LAB — URINE CULTURE

## 2014-09-12 LAB — GLUCOSE, CAPILLARY
GLUCOSE-CAPILLARY: 143 mg/dL — AB (ref 70–99)
Glucose-Capillary: 80 mg/dL (ref 70–99)
Glucose-Capillary: 107 mg/dL — ABNORMAL HIGH (ref 70–99)
Glucose-Capillary: 166 mg/dL — ABNORMAL HIGH (ref 70–99)

## 2014-09-12 MED ORDER — POTASSIUM CHLORIDE CRYS ER 20 MEQ PO TBCR: 40.0000 meq | EXTENDED_RELEASE_TABLET | Freq: Four times a day (QID) | ORAL | Status: DC

## 2014-09-12 MED ORDER — POTASSIUM CHLORIDE 20 MEQ/15ML (10%) PO LIQD
40.0000 meq | Freq: Four times a day (QID) | ORAL | Status: AC
  Administered 2014-09-12 (×2): 40 meq via ORAL
  Filled 2014-09-12 (×2): qty 30

## 2014-09-12 NOTE — Progress Notes (Signed)
MD: Please sign yellow DNR form in chart. Most form?

## 2014-09-12 NOTE — Progress Notes (Addendum)
Progress Note  [name] WUJ:[id number] DOB: 12/31/1934 DOA: 09/09/2014 PCP: Dorrene German, MD  Brief narrative: 79 year old male patient with history of diabetes, anemia of CKD, hypertension, coronary artery disease, prior strokes, and prior GI bleeding with last endoscopy in 2010 at which point he underwent ablation of AVMs, and was found to have gastritis, duodenitis and benign polyps. He was admitted in September 2014 with symptomatic anemia noting hemoglobin was 6.4 at presentation. He required transfusion. Hemoccult was negative at that admission. He presented to the ER with altered mentation with lethargy.  In the ER his hemoglobin was 4.9. He had a mildly elevated troponin and hypernatremia of 155. In addition he was Hemoccult positive. Upon arrival to the emergency department the patient's clinical status was very tenuous. He was unable to protect his airways and critical care medicine was consulted. After extensive discussion with the family at the bedside the patient was made a DO NOT RESUSCITATE. Family did not wish any further aggressive measures but were okay with continuing with blood transfusions, fluids and antibiotics. They requested no central lines, pressors or antiarrhythmics drugs. They also wished to limit needlesticks if possible.  Since admission patient has undergone CT of the head which was unremarkable for any acute intracranial changes. He has had issues with episodic hypoglycemia noting he was on Actos prior to admission. This has required infusion of dextrose fluids as well as periodic boluses with D50. Patient's mentation has improved as of 10/28 and he was awake enough to tell he was thirsty and requested Ensure. He subsequently drank an entire bottle of strawberry flavored Ensure. Since admission his urinalysis appears consistent with the urinary tract infection and had been started on empiric Rocephin.  HPI/Subjective:  Seen sitting eating breakfast with  help, oriented 2  Assessment/Plan:  Acute blood loss anemia Presented with hemoglobin of 4.9, status post transfusion of 4 units of packed RBCs. S/p 4U PRBC - follow-up CBC hgb stable at 11 and no signs of bleeding observed  Hemorrhagic shock/shock liver Resolving after transfusion -LFTS up from hypoperfusion- limiting labs for comfort reasons so will not follow closely  GI bleed Hemoccult positive at presentation without signs of obvious active bleeding - GI feel endoscopic evaluation inappropriate - suspect etiology likely AVMs - allow diet as tolerated  Thrombocytopenia Due to consumption and UTI-will not follow closely since comfort is focus  End of life care Had an additional discussion with patient's daughter [name] on 10/28 who wishes to continue with current plan. I will ask palliative/hospice for goals of care.  Acute respiratory failure with hypoxia Initial chest x-ray unremarkable and suspect hypoxemia primarily related to altered mentation and hypoventilation-now stable on room air  Acute renal failure/hyponatremia/Dehydration Baseline renal function normal in September with a BUN of 17 and a creatinine of 0.69 - at presentation BUN 55 and creatinine 1.44 - related to acute blood loss as well as low perfusion and hypotension - can follow electrolytes panel periodically given the fact that family wishes to minimize blood draws-cont DN1/2NS fluids  E Coli UTI Continue empiric Rocephin- FU on final cx  Hypokalemia Oral replete but will not obtain FU lab unless clinical status improves markedly  Elevated troponin Secondary to demand ischemia from acute blood loss anemia with associated hypoperfusion and hypotension - will not cycle any further given family's preference to avoid aggressive interventions  Diabetes mellitus Recent issues with hypoglycemia at presentation and since admission - was on Actos prior to admission and suspect acute renal  failure with poor  clearance of this medication contributing - continue IV fluids as tolerates noting poor IV access and limited lines - patient was tolerating diet with noted increase in CBGs on 10/28 but less alert 10/29  History of CVA  Recent CT of the head at admission consistent with prior CVA  Acute encephalopathy Multifactorial due to anemia with hypotension, acute renal failure, hypoglycemia and urinary tract infection  DVT prophylaxis: SCDs Code Status: DO NOT RESUSCITATE Family Communication: Extensive telephone conversation with patient's daughter [name] Disposition Plan/Expected LOS: Transfer to floor- will give IVFs for now snf treat UTI- pt may not survive to dc but SW to explore residential Hospice placement  Consultants: Gastroenterology/Dr. Leone PayorGessner  Procedures: None  Antibiotics: Rocephin 10/27 >  Objective: Blood pressure 128/60, pulse 64, temperature 97.6 F (36.4 C), temperature source Oral, resp. rate 16, height 4' (1.219 m), weight 42 kg (92 lb 9.5 oz), SpO2 99.00%.  Intake/Output Summary (Last 24 hours) at 09/12/14 1215 Last data filed at 09/12/14 [id number]  Gross per 24 hour  Intake    520 ml  Output   1025 ml  Net   -505 ml    Exam: Gen; [name] but in no respiratory distress HEENT: Bilateral scleral edema with injection Chest: Clear to auscultation bilaterally without wheezes, rhonchi or crackles, RA Cardiac: Regular rate and rhythm, no rubs murmurs or gallops Abdomen: Soft nontender nondistended without obvious hepatosplenomegaly, no ascites Extremities: no signif cyanosis, clubbing, or edema B LE   Scheduled Meds:  Scheduled Meds: . antiseptic oral rinse  7 mL Mouth Rinse BID  . cefTRIAXone (ROCEPHIN)  IV  1 g Intravenous Q24H  . cyanocobalamin  1,000 mcg Intramuscular Q30 days  . feeding supplement (ENSURE COMPLETE)  237 mL Oral BID BM   Or  . feeding supplement (ENSURE)  1 Container Oral BID BM  . pantoprazole  40 mg Oral Daily  . sodium chloride  3 mL  Intravenous Q12H    Data Reviewed: Basic Metabolic Panel:  Recent Labs Lab 09/09/14 1429 09/09/14 1500 09/11/14 0550  NA 157* 155* 152*  K 3.8 3.5* 3.3*  CL 116* 117* 116*  CO2 23  --  22  GLUCOSE 159* 157* 176*  BUN 55* 57* 51*  CREATININE 1.44* 1.60* 1.31  CALCIUM 9.1  --  8.1*   Liver Function Tests:  Recent Labs Lab 09/09/14 1429 09/11/14 0550  AST 217* 192*  ALT 101* 101*  ALKPHOS 57 82  BILITOT 0.7 0.5  PROT 6.3 5.2*  ALBUMIN 2.8* 2.2*    Recent Labs Lab 09/09/14 1429  AMMONIA 22   CBC:  Recent Labs Lab 09/09/14 1429 09/09/14 1500 09/10/14 1618 09/11/14 0550  WBC 12.4*  --  14.1* 11.9*  NEUTROABS 11.0*  --   --   --   HGB 4.9* 5.1* 12.0* 11.5*  HCT 15.9* 15.0* 35.8* 34.1*  MCV 97.5  --  REPEATED TO VERIFY 88.6  PLT 160  --  85* 81*   Cardiac Enzymes:  Recent Labs Lab 09/09/14 1429  TROPONINI 0.59*   CBG:  Recent Labs Lab 09/11/14 1533 09/11/14 2134 09/11/14 2310 09/12/14 0814 09/12/14 1152  GLUCAP 112* 32* 54* 80 107*    Recent Results (from the past 240 hour(s))  CULTURE, BLOOD (ROUTINE X 2)     Status: None   Collection Time    09/09/14  4:00 PM      Result Value Ref Range Status   Specimen Description BLOOD LEFT ARM   Final  Special Requests BOTTLES DRAWN AEROBIC ONLY 5CC   Final   Culture  Setup Time     Final   Value: 09/09/2014 22:39     Performed at [facility]   Culture     Final   Value:        BLOOD CULTURE RECEIVED NO GROWTH TO DATE CULTURE WILL BE HELD FOR 5 DAYS BEFORE ISSUING A FINAL NEGATIVE REPORT     Performed at [facility]   Report Status PENDING   Incomplete  CULTURE, BLOOD (ROUTINE X 2)     Status: None   Collection Time    09/09/14  4:30 PM      Result Value Ref Range Status   Specimen Description BLOOD LEFT HAND   Final   Special Requests BOTTLES DRAWN AEROBIC AND ANAEROBIC 5CC   Final   Culture  Setup Time     Final   Value: 09/09/2014 22:32     Performed at [facility]   Culture     Final   Value:        BLOOD CULTURE RECEIVED NO GROWTH TO DATE CULTURE WILL BE HELD FOR 5 DAYS BEFORE ISSUING A FINAL NEGATIVE REPORT     Performed at [facility]   Report Status PENDING   Incomplete  URINE CULTURE     Status: None   Collection Time    09/09/14  5:52 PM      Result Value Ref Range Status   Specimen Description URINE, CATHETERIZED   Final   Special Requests NONE   Final   Culture  Setup Time     Final   Value: 09/10/2014 00:18     Performed at [facility] Count     Final   Value: >=100,000 COLONIES/ML     Performed at [facility]   Culture     Final   Value: ESCHERICHIA COLI     Performed at [facility]   Report Status 09/12/2014 FINAL   Final   Organism ID, Bacteria ESCHERICHIA COLI   Final  MRSA PCR SCREENING     Status: None   Collection Time    09/09/14  8:30 PM      Result Value Ref Range Status   MRSA by PCR NEGATIVE  NEGATIVE Final   Comment:            The GeneXpert MRSA Assay (FDA     approved for NASAL specimens     only), is one component of a     comprehensive MRSA colonization     surveillance program. It is not     intended to diagnose MRSA     infection nor to guide or     monitor treatment for     MRSA infections.  URINE CULTURE     Status: None   Collection Time    09/10/14  6:54 PM      Result Value Ref Range Status   Specimen Description URINE, RANDOM   Final   Special Requests NONE   Final   Culture  Setup Time     Final   Value: 09/11/2014 01:05     Performed at [facility] Count     Final   Value: 50,000 COLONIES/ML     Performed at [facility]   Culture     Final   Value: ESCHERICHIA COLI     Performed at  [facility]   Report Status PENDING   Incomplete     Studies:  Recent x-ray studies have been reviewed in detail by the Attending Physician  Time spent :  35 mins   [facility] CenterELMAHI,Ginelle Bays A, MD Pager  [phone]   On-Call/Text Page:      Loretha [email or web address]      password [email or web address]  If 7PM-7AM, please contact night-coverage [email or web address] Password [email or web address] 09/12/2014, 12:15 PM   LOS: 3 days

## 2014-09-12 NOTE — Progress Notes (Signed)
Notified by Gavin Poundeborah [facility]; initial discussions with pt's daughter Samuella Cotaandora were had via telephone; however upon arrival at 5:00pm to pt's room, writer made aware daughter had left the hospital after discussion with Dr Arthor CaptainElmahi; Dr Arthor CaptainElmahi informed writer daughter has changed her mind and at this time does not wish to involve hospice/[facility] services in her father's care; she is seeking further treatment. Writer spoke with daughter Samuella Cotaandora [phone] who confirmed this decision, stating there were misunderstanding about the level of care she wished for her father and she has communicated her wishes with attending Dr Arthor CaptainElmahi.  [name] is aware she can contact [facility] at any time in the future should this decision change. Staff RN Raoul PitchSierra aware of above. [facility] made aware and referral cancelled.  Valente DavidMargie Amrit Cress, RN MSN [facility] Hospital Liaison [phone]

## 2014-09-13 ENCOUNTER — Inpatient Hospital Stay (HOSPITAL_COMMUNITY): Payer: Medicare Other

## 2014-09-13 LAB — URINE CULTURE: Colony Count: 50000

## 2014-09-13 LAB — GLUCOSE, CAPILLARY
Glucose-Capillary: 87 mg/dL (ref 70–99)
Glucose-Capillary: 89 mg/dL (ref 70–99)
Glucose-Capillary: 157 mg/dL — ABNORMAL HIGH (ref 70–99)
Glucose-Capillary: 127 mg/dL — ABNORMAL HIGH (ref 70–99)

## 2014-09-13 LAB — COMPREHENSIVE METABOLIC PANEL
ALK PHOS: 87 U/L (ref 39–117)
ALT: 146 U/L — AB (ref 0–53)
ANION GAP: 13 (ref 5–15)
AST: 245 U/L — ABNORMAL HIGH (ref 0–37)
Albumin: 2.1 g/dL — ABNORMAL LOW (ref 3.5–5.2)
BILIRUBIN TOTAL: 0.7 mg/dL (ref 0.3–1.2)
BUN: 31 mg/dL — ABNORMAL HIGH (ref 6–23)
CHLORIDE: 110 meq/L (ref 96–112)
CO2: 20 mEq/L (ref 19–32)
Calcium: 8.3 mg/dL — ABNORMAL LOW (ref 8.4–10.5)
Creatinine, Ser: 0.86 mg/dL (ref 0.50–1.35)
GFR calc Af Amer: >90 mL/min (ref >90)
GFR calc non Af Amer: 82 mL/min — ABNORMAL LOW (ref >90)
GLUCOSE: 94 mg/dL (ref 70–99)
POTASSIUM: 5.2 meq/L (ref 3.7–5.3)
SODIUM: 143 meq/L (ref 137–147)
Total Protein: 5.5 g/dL — ABNORMAL LOW (ref 6.0–8.3)

## 2014-09-13 LAB — TROPONIN I: Troponin I: <0.3 ng/mL (ref <0.30)

## 2014-09-13 LAB — CBC
HEMATOCRIT: 36.6 % — AB (ref 39.0–52.0)
Hemoglobin: 11.8 g/dL — ABNORMAL LOW (ref 13.0–17.0)
MCH: 29 pg (ref 26.0–34.0)
MCHC: 32.2 g/dL (ref 30.0–36.0)
MCV: 89.9 fL (ref 78.0–100.0)
Platelets: 80 10*3/uL — ABNORMAL LOW (ref 150–400)
RBC: 4.07 MIL/uL — ABNORMAL LOW (ref 4.22–5.81)
RDW: 18.7 % — AB (ref 11.5–15.5)
WBC: 10.2 10*3/uL (ref 4.0–10.5)

## 2014-09-13 NOTE — Progress Notes (Signed)
Progress Note  [name] [id number] DOB: Aug 17, 1935 DOA: 09/09/2014 PCP: Dorrene GermanAVBUERE,EDWIN A, MD  Brief narrative: 79 year old male patient with history of diabetes, anemia of CKD, hypertension, coronary artery disease, prior strokes, and prior GI bleeding with last endoscopy in 2010 at which point he underwent ablation of AVMs, and was found to have gastritis, duodenitis and benign polyps. He was admitted in September 2014 with symptomatic anemia noting hemoglobin was 6.4 at presentation. He required transfusion. Hemoccult was negative at that admission. He presented to the ER with altered mentation with lethargy.  In the ER his hemoglobin was 4.9. He had a mildly elevated troponin and hypernatremia of 155. In addition he was Hemoccult positive. Upon arrival to the emergency department the patient's clinical status was very tenuous. He was unable to protect his airways and critical care medicine was consulted. After extensive discussion with the family at the bedside the patient was made a DO NOT RESUSCITATE.   Since admission patient has undergone CT of the head which was unremarkable for any acute intracranial changes. He has had issues with episodic hypoglycemia noting he was on Actos prior to admission. This has required infusion of dextrose fluids as well as periodic boluses with D50. Patient's mentation has improved as of 10/28 and he was awake enough to tell he was thirsty and requested Ensure. He subsequently drank an entire bottle of strawberry flavored Ensure. Since admission his urinalysis appears consistent with the urinary tract infection and had been started on empiric Rocephin.  HPI/Subjective:  Denies any complaints this morning, oriented 2 not to the date  Assessment/Plan:  Acute blood loss anemia Presented with hemoglobin of 4.9, status post transfusion of 4 units of packed RBCs. S/p 4U PRBC - follow-up CBC hgb stable at 11 and no signs of bleeding  observed  Hemorrhagic shock/shock liver Resolving after transfusion -LFTS up from hypoperfusion. Check CMP in a.m.  GI bleed Hemoccult positive at presentation without signs of obvious active bleeding - GI feel endoscopic evaluation inappropriate - suspect etiology likely AVMs as per previous EGDs - allow diet as tolerated. Continue PPIs  Thrombocytopenia Due to consumption and UTI-will not follow closely since comfort is focus  End of life care 2 daughters Samuella Cotaandora and [name] wanted to talk to me, after prolonged discussion patient is only DNR/DNI. Daughters reported they wanted everything to be done except resuscitation, intubation or central lines. There are okay with antibiotics, transfusion, medication intervention.   Acute respiratory failure with hypoxia Initial chest x-ray unremarkable and suspect hypoxemia primarily related to altered mentation and hypoventilation-now stable on room air  Acute renal failure/hyponatremia/Dehydration Baseline renal function normal in September creatinine of 0.69 - at presentation BUN 55 and creatinine 1.44. Likely secondary to hypoperfusion, started on IV fluids.  E Coli UTI Pansensitive Escherichia coli, patient is on Rocephin, continue.  Hypokalemia Repleted orally.  Elevated troponin Secondary to demand ischemia from acute blood loss anemia with associated hypoperfusion and hypotension. No aspirin secondary to current bleeding.  Diabetes mellitus Recent issues with hypoglycemia at presentation and since admission - was on Actos prior to admission and suspect acute renal failure with poor clearance of this medication contributing - continue IV fluids as tolerates noting poor IV access and limited lines - patient was tolerating diet with noted increase in CBGs on 10/28 but less alert 10/29  History of CVA  Recent CT of the head at admission consistent with prior CVA  Acute encephalopathy Multifactorial due to anemia with hypotension,  acute  renal failure, hypoglycemia and urinary tract infection  DVT prophylaxis: SCDs Code Status: DO NOT RESUSCITATE Family Communication: Extensive telephone conversation with patient's daughter [name] Disposition Plan/Expected LOS: Transfer to floor- will give IVFs for now snf treat UTI- pt may not survive to dc but SW to explore residential Hospice placement  Consultants: Gastroenterology/Dr. Leone Payor  Procedures: None  Antibiotics: Rocephin 10/27 >  Objective: Blood pressure 144/61, pulse 75, temperature 98.2 F (36.8 C), temperature source Oral, resp. rate 13, height 4' (1.219 m), weight 47.1 kg (103 lb 13.4 oz), SpO2 95.00%.  Intake/Output Summary (Last 24 hours) at 09/13/14 1044 Last data filed at 09/13/14 0942  Gross per 24 hour  Intake    470 ml  Output   1100 ml  Net   -630 ml    Exam: Gen; [name] but in no respiratory distress HEENT: Bilateral scleral edema with injection Chest: Clear to auscultation bilaterally without wheezes, rhonchi or crackles, RA Cardiac: Regular rate and rhythm, no rubs murmurs or gallops Abdomen: Soft nontender nondistended without obvious hepatosplenomegaly, no ascites Extremities: no signif cyanosis, clubbing, or edema B LE   Scheduled Meds:  Scheduled Meds: . antiseptic oral rinse  7 mL Mouth Rinse BID  . cefTRIAXone (ROCEPHIN)  IV  1 g Intravenous Q24H  . cyanocobalamin  1,000 mcg Intramuscular Q30 days  . feeding supplement (ENSURE COMPLETE)  237 mL Oral BID BM   Or  . feeding supplement (ENSURE)  1 Container Oral BID BM  . pantoprazole  40 mg Oral Daily  . sodium chloride  3 mL Intravenous Q12H    Data Reviewed: Basic Metabolic Panel:  Recent Labs Lab 09/09/14 1429 09/09/14 1500 09/11/14 0550 09/13/14 0635  NA 157* 155* 152* 143  K 3.8 3.5* 3.3* 5.2  CL 116* 117* 116* 110  CO2 23  --  22 20  GLUCOSE 159* 157* 176* 94  BUN 55* 57* 51* 31*  CREATININE 1.44* 1.60* 1.31 0.86  CALCIUM 9.1  --  8.1* 8.3*   Liver  Function Tests:  Recent Labs Lab 09/09/14 1429 09/11/14 0550 09/13/14 0635  AST 217* 192* 245*  ALT 101* 101* 146*  ALKPHOS 57 82 87  BILITOT 0.7 0.5 0.7  PROT 6.3 5.2* 5.5*  ALBUMIN 2.8* 2.2* 2.1*    Recent Labs Lab 09/09/14 1429  AMMONIA 22   CBC:  Recent Labs Lab 09/09/14 1429 09/09/14 1500 09/10/14 1618 09/11/14 0550 09/13/14 0635  WBC 12.4*  --  14.1* 11.9* 10.2  NEUTROABS 11.0*  --   --   --   --   HGB 4.9* 5.1* 12.0* 11.5* 11.8*  HCT 15.9* 15.0* 35.8* 34.1* 36.6*  MCV 97.5  --  REPEATED TO VERIFY 88.6 89.9  PLT 160  --  85* 81* 80*   Cardiac Enzymes:  Recent Labs Lab 09/09/14 1429  TROPONINI 0.59*   CBG:  Recent Labs Lab 09/12/14 0814 09/12/14 1152 09/12/14 1653 09/12/14 2130 09/13/14 0818  GLUCAP 80 107* 166* 143* 87    Recent Results (from the past 240 hour(s))  CULTURE, BLOOD (ROUTINE X 2)     Status: None   Collection Time    09/09/14  4:00 PM      Result Value Ref Range Status   Specimen Description BLOOD LEFT ARM   Final   Special Requests BOTTLES DRAWN AEROBIC ONLY 5CC   Final   Culture  Setup Time     Final   Value: 09/09/2014 22:39     Performed at [facility]   Culture     Final   Value:        BLOOD CULTURE RECEIVED NO GROWTH TO DATE CULTURE WILL BE HELD FOR 5 DAYS BEFORE ISSUING A FINAL NEGATIVE REPORT     Performed at [facility]   Report Status PENDING   Incomplete  CULTURE, BLOOD (ROUTINE X 2)     Status: None   Collection Time    09/09/14  4:30 PM      [facility] Value Ref Range Status   Specimen Description BLOOD LEFT HAND   Final   Special Requests BOTTLES DRAWN AEROBIC AND ANAEROBIC 5CC   Final   Culture  Setup Time     Final   Value: 09/09/2014 22:32     [facility]   Culture     Final   Value:        BLOOD CULTURE RECEIVED NO GROWTH TO DATE CULTURE WILL BE HELD FOR 5 DAYS BEFORE ISSUING A FINAL NEGATIVE REPORT     [facility]   Report Status PENDING    Incomplete  URINE CULTURE     Status: None   Collection Time    09/09/14  5:52 PM      Result Value Ref Range Status   Specimen Description URINE, CATHETERIZED   Final   Special Requests NONE   Final   Culture  Setup Time     Final   Value: 09/10/2014 00:18     [facility]formed [facility]   Colony Count     Final   Value: >=100,000 COLONIES/ML     [facility]formed [facility]   Culture     Final   Value: ESCHERICHIA COLI     [facility]   Report Status 09/12/2014 FINAL   Final   Organism ID, Bacteria ESCHERICHIA COLI   Final  MRSA PCR SCREENING     Status: None   Collection Time    09/09/14  8:30 PM      Result Value Ref Range Status   MRSA by PCR NEGATIVE  NEGATIVE Final   Comment:            The GeneXpert MRSA Assay (FDA     approved for NASAL specimens     only), is one component of a     comprehensive MRSA colonization     surveillance program. It is not     intended to diagnose MRSA     infection nor to guide or     monitor treatment for     MRSA infections.  URINE CULTURE     Status: None   Collection Time    09/10/14  6:54 PM      Result Value Ref Range Status   Specimen Description URINE, RANDOM   Final   Special Requests NONE   Final   Culture  Setup Time     Final   Value: 09/11/2014 01:05     [facility]formed [facility]   Colony Count     Final   Value: 50,000 COLONIES/ML     [facility]   Culture     Final   Value: ESCHERICHIA COLI     [facility]formed at [facility]   Report Status PENDING   Incomplete     Studies:  Recent x-ray studies have been reviewed in detail by the Attending Physician  Time spent :  35 mins   Clint Lipps, MD Pager [phone]   On-Call/Text Page:      [email or web address]      password [email or web address]  If 7PM-7AM, please contact night-coverage [email or web address] Password [email or web address] 09/13/2014, 10:44 AM   LOS: 4 days

## 2014-09-13 NOTE — Progress Notes (Signed)
Pt. C/o left sided chest pressure/pain 6 on scale of 0-10.VSS - Blood pressure 152/42, pulse 100, temperature 98.2 F (36.8 C), temperature source Oral, resp. rate 18, height 4' (1.219 m), weight 47.1 kg (103 lb 13.4 oz), SpO2 95.00%., O2   @ 3 L.  IV Morphine 1mg  given.  Will notify Dr. Arthor CaptainElmahi and continue to monitor.  Forbes Cellarelcine Mazen Marcin, RN

## 2014-09-13 NOTE — Clinical Social Work Note (Signed)
CSW continues to follow this patient for d/c planning needs. CSW reviewed patient's chart regarding MD's recommendation for exploration of residential hospice with family. CSW contacted patient's daughter [name]([name]) and discussed residential hospice with her. Per daughter, family is not considering hospice at home or residentially at this time. Daughter states she has spoken with MD regarding patient remaining in the hospital. CSW to continue to be available as needs arise.  Ilka Lovick Patrick-Jefferson, LCSWA Weekend Clinical Social Worker [phone]

## 2014-09-13 NOTE — Plan of Care (Signed)
Problem: Phase I Progression Outcomes Goal: Voiding-avoid urinary catheter unless indicated Outcome: Not Progressing Pt. Has foley cath placed

## 2014-09-14 DIAGNOSIS — Z8673 Personal history of transient ischemic attack (TIA), and cerebral infarction without residual deficits: Secondary | ICD-10-CM

## 2014-09-14 DIAGNOSIS — K921 Melena: Secondary | ICD-10-CM

## 2014-09-14 DIAGNOSIS — R579 Shock, unspecified: Secondary | ICD-10-CM

## 2014-09-14 LAB — COMPREHENSIVE METABOLIC PANEL
ALBUMIN: 1.9 g/dL — AB (ref 3.5–5.2)
ALT: 127 U/L — AB (ref 0–53)
AST: 196 U/L — ABNORMAL HIGH (ref 0–37)
Alkaline Phosphatase: 70 U/L (ref 39–117)
Anion gap: 10 (ref 5–15)
BILIRUBIN TOTAL: 0.5 mg/dL (ref 0.3–1.2)
BUN: 28 mg/dL — AB (ref 6–23)
CO2: 23 mEq/L (ref 19–32)
Calcium: 8.2 mg/dL — ABNORMAL LOW (ref 8.4–10.5)
Chloride: 105 mEq/L (ref 96–112)
Creatinine, Ser: 0.8 mg/dL (ref 0.50–1.35)
GFR calc Af Amer: >90 mL/min (ref >90)
GFR calc non Af Amer: 84 mL/min — ABNORMAL LOW (ref >90)
Glucose, Bld: 156 mg/dL — ABNORMAL HIGH (ref 70–99)
POTASSIUM: 4.8 meq/L (ref 3.7–5.3)
Sodium: 138 mEq/L (ref 137–147)
TOTAL PROTEIN: 5.2 g/dL — AB (ref 6.0–8.3)

## 2014-09-14 LAB — CBC
HEMATOCRIT: 34.6 % — AB (ref 39.0–52.0)
HEMOGLOBIN: 11.2 g/dL — AB (ref 13.0–17.0)
MCH: 29.2 pg (ref 26.0–34.0)
MCHC: 32.4 g/dL (ref 30.0–36.0)
MCV: 90.1 fL (ref 78.0–100.0)
Platelets: 84 10*3/uL — ABNORMAL LOW (ref 150–400)
RBC: 3.84 MIL/uL — ABNORMAL LOW (ref 4.22–5.81)
RDW: 18 % — AB (ref 11.5–15.5)
WBC: 7.8 10*3/uL (ref 4.0–10.5)

## 2014-09-14 LAB — GLUCOSE, CAPILLARY
GLUCOSE-CAPILLARY: 175 mg/dL — AB (ref 70–99)
GLUCOSE-CAPILLARY: 156 mg/dL — AB (ref 70–99)
Glucose-Capillary: 106 mg/dL — ABNORMAL HIGH (ref 70–99)

## 2014-09-14 MED ORDER — CIPROFLOXACIN HCL 500 MG PO TABS
500.0000 mg | ORAL_TABLET | Freq: Two times a day (BID) | ORAL | Status: DC
  Administered 2014-09-14 (×2): 500 mg via ORAL
  Filled 2014-09-14 (×3): qty 1

## 2014-09-14 MED ORDER — PIPERACILLIN-TAZOBACTAM 3.375 G IVPB
3.3750 g | Freq: Three times a day (TID) | INTRAVENOUS | Status: DC
  Administered 2014-09-14 – 2014-09-16 (×5): 3.375 g via INTRAVENOUS
  Filled 2014-09-14 (×7): qty 50

## 2014-09-14 MED ORDER — RESOURCE THICKENUP CLEAR PO POWD
ORAL | Status: DC | PRN
  Filled 2014-09-14: qty 125

## 2014-09-14 NOTE — Plan of Care (Signed)
Problem: Consults Goal: Skin Care Protocol Initiated - if Braden Score 18 or less If consults are not indicated, leave blank or document N/A  Outcome: Progressing  Problem: Phase I Progression Outcomes Goal: Voiding-avoid urinary catheter unless indicated Outcome: Not Progressing  Problem: Phase II Progression Outcomes Goal: Discharge plan established Outcome: Progressing Goal: Vital signs remain stable Outcome: Progressing  Problem: Discharge Progression Outcomes Goal: Pain controlled with appropriate interventions Outcome: Progressing Goal: Hemodynamically stable Outcome: Progressing Goal: Tolerating diet Outcome: Progressing
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
## 2014-09-14 NOTE — Progress Notes (Signed)
Progress Note  [name] WUJ:[id number] DOB: January 09, 1935 DOA: 09/09/2014 PCP: Dorrene GermanAVBUERE,EDWIN A, MD  Brief narrative: 79 year old male patient with history of diabetes, anemia of CKD, hypertension, coronary artery disease, prior strokes, and prior GI bleeding with last endoscopy in 2010 at which point he underwent ablation of AVMs, and was found to have gastritis, duodenitis and benign polyps. He was admitted in September 2014 with symptomatic anemia noting hemoglobin was 6.4 at presentation. He required transfusion. Hemoccult was negative at that admission. He presented to the ER with altered mentation with lethargy.  In the ER his hemoglobin was 4.9. He had a mildly elevated troponin and hypernatremia of 155. In addition he was Hemoccult positive. Upon arrival to the emergency department the patient's clinical status was very tenuous. He was unable to protect his airways and critical care medicine was consulted. After extensive discussion with the family at the bedside the patient was made a DO NOT RESUSCITATE.   Since admission patient has undergone CT of the head which was unremarkable for any acute intracranial changes. He has had issues with episodic hypoglycemia noting he was on Actos prior to admission. This has required infusion of dextrose fluids as well as periodic boluses with D50. Patient's mentation has improved as of 10/28 and he was awake enough to tell he was thirsty and requested Ensure. He subsequently drank an entire bottle of strawberry flavored Ensure. Since admission his urinalysis appears consistent with the urinary tract infection and had been started on empiric Rocephin.  HPI/Subjective:  Denies any complaints, feels okay stating, he is hungry this morning.  Assessment/Plan:  Acute blood loss anemia Presented with hemoglobin of 4.9, status post transfusion of 4 units of packed RBCs. S/p 4U PRBC, no signs of bleeding. Follow-up hemoglobin on 11/1 is 11.2, check CBC  in a.m. Again.  Hemorrhagic shock/shock liver Resolving after transfusion -LFTS up from hypoperfusion. Check CMP in a.m.  GI bleed Hemoccult positive at presentation without signs of obvious active bleeding - GI feel endoscopic evaluation inappropriate - suspect etiology likely AVMs as per previous EGDs - allow diet as tolerated. Continue PPIs  Thrombocytopenia Due to consumption and UTI-will not follow closely since comfort is focus  End of life care 2 daughters Samuella Cotaandora and [name] wanted to talk to me, after prolonged discussion patient is only DNR/DNI. Daughters reported they wanted everything to be done except resuscitation, intubation or central lines. There are okay with antibiotics, transfusion, medication intervention. They do not want hospice or palliative care.  Acute respiratory failure with hypoxia Initial chest x-ray unremarkable and suspect hypoxemia primarily related to altered mentation and hypoventilation-now stable on room air  Acute renal failure/hyponatremia/Dehydration Baseline renal function normal in September creatinine of 0.69 - at presentation BUN 55 and creatinine 1.44. Likely secondary to hypoperfusion, started on IV fluids.  E Coli UTI Pansensitive Escherichia coli, patient is on Rocephin, switched to ciprofloxacin.  Hypokalemia Repleted orally.  Elevated troponin Secondary to demand ischemia from acute blood loss anemia with associated hypoperfusion and hypotension. No aspirin secondary to current bleeding.  Diabetes mellitus Recent issues with hypoglycemia at presentation and since admission - was on Actos prior to admission and suspect acute renal failure with poor clearance of this medication contributing - continue IV fluids as tolerates noting poor IV access and limited lines - patient was tolerating diet with noted increase in CBGs on 10/28 but less alert 10/29  History of CVA  Recent CT of the head at admission consistent with prior  CVA  Acute encephalopathy Multifactorial due to anemia with hypotension, acute renal failure, hypoglycemia and urinary tract infection  DVT prophylaxis: SCDs Code Status: DO NOT RESUSCITATE Family Communication: Extensive telephone conversation with patient's daughter [name] Disposition Plan/Expected LOS: Transfer to floor- will give IVFs for now snf treat UTI- pt may not survive to dc but SW to explore residential Hospice placement  Consultants: Gastroenterology/Dr. Leone PayorGessner  Procedures: None  Antibiotics: Rocephin 10/27 >  Objective: Blood pressure 151/48, pulse 86, temperature 98.3 F (36.8 C), temperature source Oral, resp. rate 16, height 4' (1.219 m), weight 48.3 kg (106 lb 7.7 oz), SpO2 100 %.  Intake/Output Summary (Last 24 hours) at 09/14/14 1046 Last data filed at 09/14/14 [id number]  Gross per 24 hour  Intake 4702.5 ml  Output   2600 ml  Net 2102.5 ml    Exam: Gen; [name] but in no respiratory distress HEENT: Bilateral scleral edema with injection Chest: Clear to auscultation bilaterally without wheezes, rhonchi or crackles, RA Cardiac: Regular rate and rhythm, no rubs murmurs or gallops Abdomen: Soft nontender nondistended without obvious hepatosplenomegaly, no ascites Extremities: no signif cyanosis, clubbing, or edema B LE   Scheduled Meds:  Scheduled Meds: . antiseptic oral rinse  7 mL Mouth Rinse BID  . cefTRIAXone (ROCEPHIN)  IV  1 g Intravenous Q24H  . cyanocobalamin  1,000 mcg Intramuscular Q30 days  . feeding supplement (ENSURE COMPLETE)  237 mL Oral BID BM   Or  . feeding supplement (ENSURE)  1 Container Oral BID BM  . pantoprazole  40 mg Oral Daily  . sodium chloride  3 mL Intravenous Q12H    Data Reviewed: Basic Metabolic Panel:  Recent Labs Lab 09/09/14 1429 09/09/14 1500 09/11/14 0550 09/13/14 0635 09/14/14 0549  NA 157* 155* 152* 143 138  K 3.8 3.5* 3.3* 5.2 4.8  CL 116* 117* 116* 110 105  CO2 23  --  22 20 23   GLUCOSE 159* 157*  176* 94 156*  BUN 55* 57* 51* 31* 28*  CREATININE 1.44* 1.60* 1.31 0.86 0.80  CALCIUM 9.1  --  8.1* 8.3* 8.2*   Liver Function Tests:  Recent Labs Lab 09/09/14 1429 09/11/14 0550 09/13/14 0635 09/14/14 0549  AST 217* 192* 245* 196*  ALT 101* 101* 146* 127*  ALKPHOS 57 82 87 70  BILITOT 0.7 0.5 0.7 0.5  PROT 6.3 5.2* 5.5* 5.2*  ALBUMIN 2.8* 2.2* 2.1* 1.9*    Recent Labs Lab 09/09/14 1429  AMMONIA 22   CBC:  Recent Labs Lab 09/09/14 1429 09/09/14 1500 09/10/14 1618 09/11/14 0550 09/13/14 0635 09/14/14 0549  WBC 12.4*  --  14.1* 11.9* 10.2 7.8  NEUTROABS 11.0*  --   --   --   --   --   HGB 4.9* 5.1* 12.0* 11.5* 11.8* 11.2*  HCT 15.9* 15.0* 35.8* 34.1* 36.6* 34.6*  MCV 97.5  --  REPEATED TO VERIFY 88.6 89.9 90.1  PLT 160  --  85* 81* 80* 84*   Cardiac Enzymes:  Recent Labs Lab 09/09/14 1429 09/13/14 1247 09/13/14 1620 09/13/14 2242  TROPONINI 0.59* <0.30 <0.30 <0.30   CBG:  Recent Labs Lab 09/13/14 0818 09/13/14 1211 09/13/14 1722 09/13/14 2135 09/14/14 0818  GLUCAP 87 89 157* 127* 106*    Recent Results (from the past 240 hour(s))  Culture, blood (routine x 2)     Status: None (Preliminary result)   Collection Time: 09/09/14  4:00 PM  Result Value Ref Range Status   Specimen Description BLOOD LEFT ARM  Final   Special Requests BOTTLES DRAWN AEROBIC ONLY 5CC  Final   Culture  Setup Time   Final    09/09/2014 22:39 Performed at [facility]   Culture   Final           BLOOD CULTURE RECEIVED NO GROWTH TO DATE CULTURE WILL BE HELD FOR 5 DAYS BEFORE ISSUING A FINAL NEGATIVE REPORT Performed at [facility]   Report Status PENDING  Incomplete  Culture, blood (routine x 2)     Status: None (Preliminary result)   Collection Time: 09/09/14  4:30 PM  Result Value Ref Range Status   Specimen Description BLOOD LEFT HAND  Final   Special Requests BOTTLES DRAWN AEROBIC AND ANAEROBIC 5CC  Final   Culture  Setup Time   Final     09/09/2014 22:32 Performed at [facility]   Culture   Final           BLOOD CULTURE RECEIVED NO GROWTH TO DATE CULTURE WILL BE HELD FOR 5 DAYS BEFORE ISSUING A FINAL NEGATIVE REPORT Performed at [facility]   Report Status PENDING  Incomplete  Urine culture     Status: None   Collection Time: 09/09/14  5:52 PM  Result Value Ref Range Status   Specimen Description URINE, CATHETERIZED  Final   Special Requests NONE  Final   Culture  Setup Time   Final    09/10/2014 00:18 Performed at [facility] Count   Final    >=100,000 COLONIES/ML Performed at [facility]   Culture   Final    ESCHERICHIA COLI Performed at [facility]   Report Status 09/12/2014 FINAL  Final   Organism ID, Bacteria ESCHERICHIA COLI  Final      Susceptibility   Escherichia coli - MIC*    AMPICILLIN 8 SENSITIVE Sensitive     CEFAZOLIN <=4 SENSITIVE Sensitive     CEFTRIAXONE <=1 SENSITIVE Sensitive     CIPROFLOXACIN <=0.25 SENSITIVE Sensitive     GENTAMICIN <=1 SENSITIVE Sensitive     LEVOFLOXACIN <=0.12 SENSITIVE Sensitive     NITROFURANTOIN 32 SENSITIVE Sensitive     TOBRAMYCIN <=1 SENSITIVE Sensitive     TRIMETH/SULFA <=20 SENSITIVE Sensitive     PIP/TAZO <=4 SENSITIVE Sensitive     * ESCHERICHIA COLI  MRSA PCR Screening     Status: None   Collection Time: 09/09/14  8:30 PM  Result Value Ref Range Status   MRSA by PCR NEGATIVE NEGATIVE Final    Comment:        The GeneXpert MRSA Assay (FDA approved for NASAL specimens only), is one component of a comprehensive MRSA colonization surveillance program. It is not intended to diagnose MRSA infection nor to guide or monitor treatment for MRSA infections.  Culture, Urine     Status: None   Collection Time: 09/10/14  6:54 PM  Result Value Ref Range Status   Specimen Description URINE, RANDOM  Final   Special Requests NONE  Final   Culture  Setup Time   Final    09/11/2014 01:05 Performed at [facility] Count   Final    50,000 COLONIES/ML Performed at [facility]   Culture   Final    ESCHERICHIA COLI Performed at [facility]   Report Status 09/13/2014 FINAL  Final   Organism ID, Bacteria ESCHERICHIA COLI  Final      Susceptibility   Escherichia coli -  MIC*    AMPICILLIN 4 SENSITIVE Sensitive     CEFAZOLIN <=4 SENSITIVE Sensitive     CEFTRIAXONE <=1 SENSITIVE Sensitive     CIPROFLOXACIN <=0.25 SENSITIVE Sensitive     GENTAMICIN <=1 SENSITIVE Sensitive     LEVOFLOXACIN <=0.12 SENSITIVE Sensitive     NITROFURANTOIN <=16 SENSITIVE Sensitive     TOBRAMYCIN <=1 SENSITIVE Sensitive     TRIMETH/SULFA <=20 SENSITIVE Sensitive     PIP/TAZO <=4 SENSITIVE Sensitive     * ESCHERICHIA COLI     Studies:  Recent x-ray studies have been reviewed in detail by the Attending Physician  Time spent :  35 mins   [facility] A, MD Pager [phone]   On-Call/Text Page:      [email or web address]      password [email or web address]  If 7PM-7AM, please contact night-coverage [email or web address] Password [email or web address] 09/14/2014, 10:46 AM   LOS: 5 days

## 2014-09-14 NOTE — Consult Note (Signed)
ANTIBIOTIC CONSULT NOTE - INITIAL  Pharmacy Consult for Zosyn Indication: GNR bacteremia, UTI  No Known Allergies  Patient Measurements: Height: 4' (121.9 cm) Weight: 106 lb 7.7 oz (48.3 kg) IBW/kg (Calculated) : 22.4  Vital Signs: Temp: 97.9 F (36.6 C) (11/01 1700) BP: 123/43 mmHg (11/01 1700) Pulse Rate: 78 (11/01 1700) Intake/Output from previous day: 10/31 0701 - 11/01 0700 In: 4782.5 [P.O.:920; I.V.:3712.5; IV Piggyback:150] Out: 2600 [Urine:2600] Intake/Output from this shift:    Labs:  Recent Labs  09/13/14 0635 09/14/14 0549  WBC 10.2 7.8  HGB 11.8* 11.2*  PLT 80* 84*  CREATININE 0.86 0.80   Estimated Creatinine Clearance: 35.9 mL/min (by C-G formula based on Cr of 0.8).   Microbiology: Recent Results (from the past 720 hour(s))  Culture, blood (routine x 2)     Status: None (Preliminary result)   Collection Time: 09/09/14  4:00 PM  Result Value Ref Range Status   Specimen Description BLOOD LEFT ARM  Final   Special Requests BOTTLES DRAWN AEROBIC ONLY 5CC  Final   Culture  Setup Time   Final    09/09/2014 22:39 [facility]formed [facility]   Culture   Final           BLOOD CULTURE RECEIVED NO GROWTH TO DATE CULTURE WILL BE HELD FOR 5 DAYS BEFORE ISSUING A FINAL NEGATIVE REPORT [facility]   Report Status PENDING  Incomplete  Culture, blood (routine x 2)     Status: None (Preliminary result)   Collection Time: 09/09/14  4:30 PM  Result Value Ref Range Status   Specimen Description BLOOD LEFT HAND  Final   Special Requests BOTTLES DRAWN AEROBIC AND ANAEROBIC 5CC  Final   Culture  Setup Time   Final    09/09/2014 22:32 [facility]formed at [facility]    Culture   Final    GRAM NEGATIVE RODS Note: Gram Stain Report Called to,Read Back By and Verified With: ANGELO BRICKHOUSE 09/14/14 @ 8:40PM BY RUSCOE A. [facility]formed [facility]    Report Status PENDING  Incomplete  Urine culture     Status: None   Collection  Time: 09/09/14  5:52 PM  Result Value Ref Range Status   Specimen Description URINE, CATHETERIZED  Final   Special Requests NONE  Final   Culture  Setup Time   Final    09/10/2014 00:18 [facility]formed [facility]   Colony Count   Final    >=100,000 COLONIES/ML [facility]formed at [facility]   Culture   Final    ESCHERICHIA COLI Performed at [facility]   Report Status 09/12/2014 FINAL  Final   Organism ID, Bacteria ESCHERICHIA COLI  Final      Susceptibility   Escherichia coli - MIC*    AMPICILLIN 8 SENSITIVE Sensitive     CEFAZOLIN <=4 SENSITIVE Sensitive     CEFTRIAXONE <=1 SENSITIVE Sensitive     CIPROFLOXACIN <=0.25 SENSITIVE Sensitive     GENTAMICIN <=1 SENSITIVE Sensitive     LEVOFLOXACIN <=0.12 SENSITIVE Sensitive     NITROFURANTOIN 32 SENSITIVE Sensitive     TOBRAMYCIN <=1 SENSITIVE Sensitive     TRIMETH/SULFA <=20 SENSITIVE Sensitive     PIP/TAZO <=4 SENSITIVE Sensitive     * ESCHERICHIA COLI  MRSA PCR Screening     Status: None   Collection Time: 09/09/14  8:30 PM  Result Value Ref Range Status   MRSA by PCR NEGATIVE NEGATIVE Final  Comment:        The GeneXpert MRSA Assay (FDA approved for NASAL specimens only), is one component of a comprehensive MRSA colonization surveillance program. It is not intended to diagnose MRSA infection nor to guide or monitor treatment for MRSA infections.  Culture, Urine     Status: None   Collection Time: 09/10/14  6:54 PM  Result Value Ref Range Status   Specimen Description URINE, RANDOM  Final   Special Requests NONE  Final   Culture  Setup Time   Final    09/11/2014 01:05 [facility]formed [facility]   Colony Count   Final    50,000 COLONIES/ML [facility]   Culture   Final    ESCHERICHIA COLI [facility]formed at [facility]   Report Status 09/13/2014 FINAL  Final   Organism ID, Bacteria ESCHERICHIA COLI  Final      Susceptibility   Escherichia coli - MIC*     AMPICILLIN 4 SENSITIVE Sensitive     CEFAZOLIN <=4 SENSITIVE Sensitive     CEFTRIAXONE <=1 SENSITIVE Sensitive     CIPROFLOXACIN <=0.25 SENSITIVE Sensitive     GENTAMICIN <=1 SENSITIVE Sensitive     LEVOFLOXACIN <=0.12 SENSITIVE Sensitive     NITROFURANTOIN <=16 SENSITIVE Sensitive     TOBRAMYCIN <=1 SENSITIVE Sensitive     TRIMETH/SULFA <=20 SENSITIVE Sensitive     PIP/TAZO <=4 SENSITIVE Sensitive     * ESCHERICHIA COLI    Medical History: Past Medical History  Diagnosis Date  . Diabetes mellitus   . Hypertension   . Myocardial infarction 2005  . Blood clot in vein   . Blindness temporary   . Hyperlipidemia   . CAD (coronary artery disease)   . Stroke 10/2008    resulting in left side weakness  . Protein calorie malnutrition 2011  . Atrial fibrillation 2010  . Gallstones 11/2010    seen on CT  . Compression fracture 11/2010    lumbar spine, seen on CT  . COPD (chronic obstructive pulmonary disease)   . Shortness of breath    Assessment: 79yom on PO cipro for Ecoli UTI now with GNR growing in his blood. Antibiotics will be broadened to Zosyn.  Goal of Therapy:  Appropriate dosing  Plan:  1) Zosyn 3.375g IV q8 (4 hour infusion) 2) Follow up culture speciation and sensitivies  Fredrik RiggerMarkle, Jacon Whetzel Sue 09/14/2014,9:04 PM

## 2014-09-14 NOTE — Clinical Social Work Psychosocial (Signed)
Clinical Social Work Department BRIEF PSYCHOSOCIAL ASSESSMENT 09/14/2014  Patient:  [name],[name]     Account Number:  [id number]     Admit date:  09/09/2014  Clinical Social Worker:  Earnestine LeysPatrick-jefferson,Rhandi Despain, LCSWA  Date/Time:  09/14/2014 07:12 PM  Referred by:  Physician  Date Referred:  09/14/2014 Referred for  Residential hospice placement   Other Referral:   Interview type:  Family Other interview type:   Kevin (daughter)    PSYCHOSOCIAL DATA Living Status:  WITH ADULT CHILDREN Admitted from facility:   Level of care:   Primary support name:  Ronnette Juniperandora Snow [phone]) Primary support relationship to patient:  CHILD, ADULT Degree of support available:   Good    CURRENT CONCERNS Current Concerns  Post-Acute Placement   Other Concerns:    SOCIAL WORK ASSESSMENT / PLAN CSW contacted patient's daughter [name] and introduced self and explained role. CSW discussed residential hospice placement with daughter. Per daughter, patient resides with her and uses equipment at home. Patient's daughter stated it has been difficult for her to watch patient's health decline. Kevin states hospice discussion was a misunderstanding and at this time, family will not seek hospice. CSW to continue to follow for support.   Assessment/plan status:  Psychosocial Support/Ongoing Assessment of Needs Other assessment/ plan:   Information/referral to community resources:    PATIENT'S/FAMILY'S RESPONSE TO PLAN OF CARE: Patient's daughter was pleasant and cooperative and thanked CSW for communication.   [name] Patrick-Jefferson, LCSWA Weekend Clinical Social Worker [phone]

## 2014-09-15 ENCOUNTER — Inpatient Hospital Stay: Payer: Medicare Other | Admitting: Internal Medicine

## 2014-09-15 DIAGNOSIS — R7881 Bacteremia: Secondary | ICD-10-CM | POA: Diagnosis present

## 2014-09-15 DIAGNOSIS — B9620 Unspecified Escherichia coli [E. coli] as the cause of diseases classified elsewhere: Secondary | ICD-10-CM | POA: Diagnosis present

## 2014-09-15 LAB — GLUCOSE, CAPILLARY
GLUCOSE-CAPILLARY: 160 mg/dL — AB (ref 70–99)
GLUCOSE-CAPILLARY: 160 mg/dL — AB (ref 70–99)
Glucose-Capillary: 106 mg/dL — ABNORMAL HIGH (ref 70–99)
Glucose-Capillary: 107 mg/dL — ABNORMAL HIGH (ref 70–99)
Glucose-Capillary: 147 mg/dL — ABNORMAL HIGH (ref 70–99)

## 2014-09-15 LAB — COMPREHENSIVE METABOLIC PANEL
ALT: 118 U/L — ABNORMAL HIGH (ref 0–53)
AST: 173 U/L — AB (ref 0–37)
Albumin: 1.9 g/dL — ABNORMAL LOW (ref 3.5–5.2)
Alkaline Phosphatase: 63 U/L (ref 39–117)
Anion gap: 9 (ref 5–15)
BUN: 30 mg/dL — ABNORMAL HIGH (ref 6–23)
CALCIUM: 8.3 mg/dL — AB (ref 8.4–10.5)
CO2: 28 mEq/L (ref 19–32)
CREATININE: 0.84 mg/dL (ref 0.50–1.35)
Chloride: 100 mEq/L (ref 96–112)
GFR, EST NON AFRICAN AMERICAN: 82 mL/min — AB (ref >90)
GLUCOSE: 130 mg/dL — AB (ref 70–99)
Potassium: 4.9 mEq/L (ref 3.7–5.3)
Sodium: 137 mEq/L (ref 137–147)
Total Bilirubin: 0.7 mg/dL (ref 0.3–1.2)
Total Protein: 5.2 g/dL — ABNORMAL LOW (ref 6.0–8.3)

## 2014-09-15 LAB — CBC
HCT: 34.1 % — ABNORMAL LOW (ref 39.0–52.0)
Hemoglobin: 11 g/dL — ABNORMAL LOW (ref 13.0–17.0)
MCH: 29.1 pg (ref 26.0–34.0)
MCHC: 32.3 g/dL (ref 30.0–36.0)
MCV: 90.2 fL (ref 78.0–100.0)
Platelets: 77 10*3/uL — ABNORMAL LOW (ref 150–400)
RBC: 3.78 MIL/uL — AB (ref 4.22–5.81)
RDW: 17.7 % — ABNORMAL HIGH (ref 11.5–15.5)
WBC: 7.1 10*3/uL (ref 4.0–10.5)

## 2014-09-15 LAB — CULTURE, BLOOD (ROUTINE X 2): CULTURE: NO GROWTH

## 2014-09-15 NOTE — Progress Notes (Signed)
CRITICAL VALUE ALERT  Critical value received: Gram negative rods  Date of notification:  09-14-14  Time of notification: 2039  Critical value read back:yes  Nurse who received alert:Tameisha Covell,RN  MD notified (1st page):  Rollen SoxKatherine Schorr,NP  Time of first page:2050  MD notified (2nd page):  Time of second page:  Responding MD: Rollen SoxKatherine Schorr,NP  Time MD responded: 2101

## 2014-09-15 NOTE — Plan of Care (Signed)
Problem: Phase I Progression Outcomes Goal: OOB as tolerated unless otherwise ordered Outcome: Adequate for Discharge     

## 2014-09-15 NOTE — Progress Notes (Signed)
Progress Note  [name] AVW:[id number] DOB: 1935/04/05 DOA: 09/09/2014 PCP: Dorrene German, MD  Brief narrative: 79 year old male patient with history of diabetes, anemia of CKD, hypertension, coronary artery disease, prior strokes, and prior GI bleeding with last endoscopy in 2010 at which point he underwent ablation of AVMs, and was found to have gastritis, duodenitis and benign polyps. He was admitted in September 2014 with symptomatic anemia noting hemoglobin was 6.4 at presentation. He required transfusion. Hemoccult was negative at that admission. He presented to the ER with altered mentation with lethargy.  In the ER his hemoglobin was 4.9. He had a mildly elevated troponin and hypernatremia of 155. In addition he was Hemoccult positive. Upon arrival to the emergency department the patient's clinical status was very tenuous. He was unable to protect his airways and critical care medicine was consulted. After extensive discussion with the family at the bedside the patient was made a DO NOT RESUSCITATE.   Since admission patient has undergone CT of the head which was unremarkable for any acute intracranial changes. He has had issues with episodic hypoglycemia noting he was on Actos prior to admission. This has required infusion of dextrose fluids as well as periodic boluses with D50. Patient's mentation has improved as of 10/28 and he was awake enough to tell he was thirsty and requested Ensure. He subsequently drank an entire bottle of strawberry flavored Ensure. Since admission his urinalysis appears consistent with the urinary tract infection and had been started on empiric Rocephin.  HPI/Subjective:  Feels okay oriented 2 to place and self, wants to eat, denies any specific complaints. Was about to be discharged today, culture was negative from overnight. Waiting for identification.  Assessment/Plan:   Gram-negative rods bacteremia One out of 2 blood cultures positive for  gram negative rods bacteria, identification to follow. Patient was on Rocephin, switched to Cipro and then Zosyn when the culture turned out positive. Continue Zosyn until we have identification, likely this is Escherichia coli from the concurrent UTI.  Acute blood loss anemia Presented with hemoglobin of 4.9, status post transfusion of 4 units of packed RBCs. S/p 4U PRBC, no signs of bleeding. Follow-up hemoglobin on 11/1 is 11.2, check CBC in a.m. Again.  E Coli UTI Pansensitive Escherichia coli, patient is on Rocephin, switched to ciprofloxacin.  Hemorrhagic shock/shock liver Resolving after transfusion -LFTS up from hypoperfusion. Check CMP in a.m.  GI bleed Hemoccult positive at presentation without signs of obvious active bleeding - GI feel endoscopic evaluation inappropriate - suspect etiology likely AVMs as per previous EGDs - allow diet as tolerated. Continue PPIs  Thrombocytopenia Due to consumption and UTI-will not follow closely since comfort is focus  Goals of care 2 daughters [name] and [name] wanted to talk to me, after prolonged discussion patient is only DNR/DNI. Daughters reported they wanted everything to be done except resuscitation, intubation or central lines. There are okay with antibiotics, transfusion, medication intervention. They do not want hospice or palliative care.  Acute respiratory failure with hypoxia Initial chest x-ray unremarkable and suspect hypoxemia primarily related to altered mentation and hypoventilation-now stable on room air  Acute renal failure/hyponatremia/Dehydration Baseline renal function normal in September creatinine of 0.69 - at presentation BUN 55 and creatinine 1.44. Likely secondary to hypoperfusion, started on IV fluids. This is improved, IV fluids discontinued.  Hypokalemia Repleted orally.  Elevated troponin Secondary to demand ischemia from acute blood loss anemia with associated hypoperfusion and hypotension. No  aspirin secondary to current bleeding.  Diabetes mellitus Recent issues with hypoglycemia at presentation and since admission - was on Actos prior to admission and suspect acute renal failure with poor clearance of this medication contributing - continue IV fluids as tolerates noting poor IV access and limited lines - patient was tolerating diet with noted increase in CBGs on 10/28 but less alert 10/29  History of CVA  Recent CT of the head at admission consistent with prior CVA  Acute encephalopathy Multifactorial due to anemia with hypotension, acute renal failure, hypoglycemia and urinary tract infection  DVT prophylaxis: SCDs Code Status: DO NOT RESUSCITATE Family Communication: Extensive telephone conversation with patient's daughter [name] Disposition Plan/Expected LOS: Transfer to floor- will give IVFs for now snf treat UTI- pt may not survive to dc but SW to explore residential Hospice placement  Consultants: Gastroenterology/Dr. Leone PayorGessner  Procedures: None  Antibiotics: Rocephin 10/27 >  Objective: Blood pressure 137/46, pulse 79, temperature 98.8 F (37.1 C), temperature source Oral, resp. rate 18, height 4' (1.219 m), weight 48.2 kg (106 lb 4.2 oz), SpO2 100 %.  Intake/Output Summary (Last 24 hours) at 09/15/14 1211 Last data filed at 09/15/14 [id number]  Gross per 24 hour  Intake    200 ml  Output   1475 ml  Net  -1275 ml    Exam: Gen; [name] but in no respiratory distress HEENT: Bilateral scleral edema with injection Chest: Clear to auscultation bilaterally without wheezes, rhonchi or crackles, RA Cardiac: Regular rate and rhythm, no rubs murmurs or gallops Abdomen: Soft nontender nondistended without obvious hepatosplenomegaly, no ascites Extremities: no signif cyanosis, clubbing, or edema B LE   Scheduled Meds:  Scheduled Meds: . antiseptic oral rinse  7 mL Mouth Rinse BID  . cyanocobalamin  1,000 mcg Intramuscular Q30 days  . feeding supplement (ENSURE  COMPLETE)  237 mL Oral BID BM   Or  . feeding supplement (ENSURE)  1 Container Oral BID BM  . pantoprazole  40 mg Oral Daily  . piperacillin-tazobactam (ZOSYN)  IV  3.375 g Intravenous 3 times per day  . sodium chloride  3 mL Intravenous Q12H    Data Reviewed: Basic Metabolic Panel:  Recent Labs Lab 09/09/14 1429 09/09/14 1500 09/11/14 0550 09/13/14 0635 09/14/14 0549 09/15/14 0503  NA 157* 155* 152* 143 138 137  K 3.8 3.5* 3.3* 5.2 4.8 4.9  CL 116* 117* 116* 110 105 100  CO2 23  --  22 20 23 28   GLUCOSE 159* 157* 176* 94 156* 130*  BUN 55* 57* 51* 31* 28* 30*  CREATININE 1.44* 1.60* 1.31 0.86 0.80 0.84  CALCIUM 9.1  --  8.1* 8.3* 8.2* 8.3*   Liver Function Tests:  Recent Labs Lab 09/09/14 1429 09/11/14 0550 09/13/14 0635 09/14/14 0549 09/15/14 0503  AST 217* 192* 245* 196* 173*  ALT 101* 101* 146* 127* 118*  ALKPHOS 57 82 87 70 63  BILITOT 0.7 0.5 0.7 0.5 0.7  PROT 6.3 5.2* 5.5* 5.2* 5.2*  ALBUMIN 2.8* 2.2* 2.1* 1.9* 1.9*    Recent Labs Lab 09/09/14 1429  AMMONIA 22   CBC:  Recent Labs Lab 09/09/14 1429  09/10/14 1618 09/11/14 0550 09/13/14 0635 09/14/14 0549 09/15/14 0503  WBC 12.4*  --  14.1* 11.9* 10.2 7.8 7.1  NEUTROABS 11.0*  --   --   --   --   --   --   HGB 4.9*  < > 12.0* 11.5* 11.8* 11.2* 11.0*  HCT 15.9*  < > 35.8* 34.1* 36.6* 34.6* 34.1*  MCV 97.5  --  REPEATED TO VERIFY 88.6 89.9 90.1 90.2  PLT 160  --  85* 81* 80* 84* 77*  < > = values in this interval not displayed. Cardiac Enzymes:  Recent Labs Lab 09/09/14 1429 09/13/14 1247 09/13/14 1620 09/13/14 2242  TROPONINI 0.59* <0.30 <0.30 <0.30   CBG:  Recent Labs Lab 09/14/14 1323 09/14/14 1741 09/14/14 2233 09/15/14 0806 09/15/14 1148  GLUCAP 106* 175* 156* 107* 147*    Recent Results (from the past 240 hour(s))  Culture, blood (routine x 2)     Status: None   Collection Time: 09/09/14  4:00 PM  Result Value Ref Range Status   Specimen Description BLOOD LEFT ARM   Final   Special Requests BOTTLES DRAWN AEROBIC ONLY 5CC  Final   Culture  Setup Time   Final    09/09/2014 22:39 Performed at [facility]    Culture   Final    NO GROWTH 5 DAYS Performed at [facility]    Report Status 09/15/2014 FINAL  Final  Culture, blood (routine x 2)     Status: None (Preliminary result)   Collection Time: 09/09/14  4:30 PM  Result Value Ref Range Status   Specimen Description BLOOD LEFT HAND  Final   Special Requests BOTTLES DRAWN AEROBIC AND ANAEROBIC 5CC  Final   Culture  Setup Time   Final    09/09/2014 22:32 Performed at [facility]    Culture   Final    GRAM NEGATIVE RODS Note: Gram Stain Report Called to,Read Back By and Verified With: ANGELO BRICKHOUSE 09/14/14 @ 8:40PM BY RUSCOE A. Performed at [facility]    Report Status PENDING  Incomplete  Urine culture     Status: None   Collection Time: 09/09/14  5:52 PM  Result Value Ref Range Status   Specimen Description URINE, CATHETERIZED  Final   Special Requests NONE  Final   Culture  Setup Time   Final    09/10/2014 00:18 Performed at [facility] Count   Final    >=100,000 COLONIES/ML Performed at [facility]   Culture   Final    ESCHERICHIA COLI Performed at [facility]   Report Status 09/12/2014 FINAL  Final   Organism ID, Bacteria ESCHERICHIA COLI  Final      Susceptibility   Escherichia coli - MIC*    AMPICILLIN 8 SENSITIVE Sensitive     CEFAZOLIN <=4 SENSITIVE Sensitive     CEFTRIAXONE <=1 SENSITIVE Sensitive     CIPROFLOXACIN <=0.25 SENSITIVE Sensitive     GENTAMICIN <=1 SENSITIVE Sensitive     LEVOFLOXACIN <=0.12 SENSITIVE Sensitive     NITROFURANTOIN 32 SENSITIVE Sensitive     TOBRAMYCIN <=1 SENSITIVE Sensitive     TRIMETH/SULFA <=20 SENSITIVE Sensitive     PIP/TAZO <=4 SENSITIVE Sensitive     * ESCHERICHIA COLI  MRSA PCR Screening     Status: None   Collection Time: 09/09/14  8:30 PM  Result Value Ref  Range Status   MRSA by PCR NEGATIVE NEGATIVE Final    Comment:        The GeneXpert MRSA Assay (FDA approved for NASAL specimens only), is one component of a comprehensive MRSA colonization surveillance program. It is not intended to diagnose MRSA infection nor to guide or monitor treatment for MRSA infections.  Culture, Urine     Status: None   Collection Time: 09/10/14  6:54 PM  Result Value Ref Range Status  Specimen Description URINE, RANDOM  Final   Special Requests NONE  Final   Culture  Setup Time   Final    09/11/2014 01:05 [facility]   Colony Count   Final    50,000 COLONIES/ML [facility]   Culture   Final    ESCHERICHIA COLI [facility]   Report Status 09/13/2014 FINAL  Final   Organism ID, Bacteria ESCHERICHIA COLI  Final      Susceptibility   Escherichia coli - MIC*    AMPICILLIN 4 SENSITIVE Sensitive     CEFAZOLIN <=4 SENSITIVE Sensitive     CEFTRIAXONE <=1 SENSITIVE Sensitive     CIPROFLOXACIN <=0.25 SENSITIVE Sensitive     GENTAMICIN <=1 SENSITIVE Sensitive     LEVOFLOXACIN <=0.12 SENSITIVE Sensitive     NITROFURANTOIN <=16 SENSITIVE Sensitive     TOBRAMYCIN <=1 SENSITIVE Sensitive     TRIMETH/SULFA <=20 SENSITIVE Sensitive     PIP/TAZO <=4 SENSITIVE Sensitive     * ESCHERICHIA COLI     Studies:  Recent x-ray studies have been reviewed in detail by the Attending Physician  Time spent :  35 mins   [facility] HospitalELMAHI,Eliu Batch A, MD Pager [phone]   On-Call/Text Page:      [email or web address]      password [email or web address]  If 7PM-7AM, please contact night-coverage [email or web address] Password [email or web address] 09/15/2014, 12:11 PM   LOS: 6 days

## 2014-09-15 NOTE — Progress Notes (Signed)
NUTRITION FOLLOW UP  Patient continues to meet criteria for severe malnutrition in the context of chronic illness as evidenced by severe muscle loss and severe subcutaneous fat loss.  Intervention:    Continue Ensure Complete po BID, each supplement provides 350 kcal and 13 grams of protein  Continue Ensure Pudding po BID, each supplement provides 170 kcal and 4 grams of protein  RD to follow for nutrition care plan  Nutrition Dx:   Increased nutrient needs related to malnutrition, wound healing as evidenced by estimated nutrition needs  Goal:   Pt to achieve best nutritional intake possible given very poor prognosis   Monitor:   PO & supplemental intake, goals of care, weight, labs, I/O's  Assessment:   79 year old Male patient with history of diabetes, anemia of CKD, HTN, CAD, prior strokes, and prior GI bleeding; presented to ER with altered mentation with lethargy.  Since admission urinalysis appears consistent with urinary tract infection and pt has been started on empiric Rocephin.  Chart reviewed. Prognosis remains poor, however, family is refusing hospice services at this time. SLP following; pt continues to dysphagia 1 diet with honey thick liquids due to severe aspiration risk. Intake is variable; PO: 10-75%.  He remains on Ensure Pudding and Ensure complete supplements. Nursing has been rotating supplements to maximize acceptance. Take approximately 50-75% of supplements.  Labs reviewed. BUN: 30, Calcium: 8.3, Glucose: 130. CBGS: 107-175.  Height: Ht Readings from Last 1 Encounters:  09/09/14 4' (1.219 m)    Weight Status:   Wt Readings from Last 1 Encounters:  09/15/14 106 lb 4.2 oz (48.2 kg)   09/11/14 92 lb 9.5 oz (42 kg)   Re-estimated needs:  Kcal: 1500-1700 Protein: 63-73 grams Fluid: 1.5-1.7 L  Skin: Stage II pressure ulcer to sacrum  Diet Order: DIET - DYS 1 with honey thick liquids   Intake/Output Summary (Last 24 hours) at 09/15/14 0950 Last  data filed at 09/15/14 0906  Gross per 24 hour  Intake    200 ml  Output   1475 ml  Net  -1275 ml    Last BM: 09/14/14   Labs:   Recent Labs Lab 09/13/14 0635 09/14/14 0549 09/15/14 0503  NA 143 138 137  K 5.2 4.8 4.9  CL 110 105 100  CO2 20 23 28   BUN 31* 28* 30*  CREATININE 0.86 0.80 0.84  CALCIUM 8.3* 8.2* 8.3*  GLUCOSE 94 156* 130*    CBG (last 3)   Recent Labs  09/14/14 1741 09/14/14 2233 09/15/14 0806  GLUCAP 175* 156* 107*    Scheduled Meds: . antiseptic oral rinse  7 mL Mouth Rinse BID  . cyanocobalamin  1,000 mcg Intramuscular Q30 days  . feeding supplement (ENSURE COMPLETE)  237 mL Oral BID BM   Or  . feeding supplement (ENSURE)  1 Container Oral BID BM  . pantoprazole  40 mg Oral Daily  . piperacillin-tazobactam (ZOSYN)  IV  3.375 g Intravenous 3 times per day  . sodium chloride  3 mL Intravenous Q12H    Continuous Infusions: . dextrose 5 % and 0.45% NaCl 10 mL/hr at 09/14/14 0805    Milaya Hora A. Mayford KnifeWilliams, RD, LDN Pager: [phone] After hours Pager: [phone]

## 2014-09-15 NOTE — Progress Notes (Signed)
Speech Language Pathology Treatment: Dysphagia  Patient Details Name: [name] MRN: [id number] DOB: April 26, 1935 Today's Date: 09/15/2014 Time: 1005-1015 SLP Time Calculation (min): 10 min  Assessment / Plan / Recommendation Clinical Impression  Pt. seen for skilled dysphagia intervention.  On arrival, he sounds significantly less congested than yesterday.  Delayed oral propulsion with nectar thick liquid and suspected delayed swallow initiation.  SLP provided moderate cues for smaller sips with clinical reasoning.  He required verbal cues to initiate swallow quicker after taking sip versus holding in oral cavity; likely pt. is unable to recall and implement and needs full supervision for cues.  Wet vocal quality present indicating possible laryngeal compromise.  He is likely having intermittent aspiration episodes.  ST will continue to follow.    HPI HPI: 79 y.o. male, with past medical history significant for diabetes mellitus, MI, appetite loss, CVA, hypertension and coronary artery disease admitted with altered mental status, lethargy, workup did show hemoglobin of 4.9, heart failure with a creatinine of 1.6, elevated troponin, and hypernatremia of 155 , patient was Hemoccult positive, was unable to protect his airways, critical care were consulted, but patient was made DO NOT RESUSCITATE by the family at bedside, with family wishes for no aggressive measures.  CXR Left basilar airspace disease versus small pleural effusion. MBS 2009 results unknown.  MD stated he ordered swallow assessement due to pt. being placed on Dys 1 diet texture.  RN reports coughing with thin liquids.   Pertinent Vitals Pain Assessment:  (left leg pain)  SLP Plan  Continue with current plan of care    Recommendations Diet recommendations: Dysphagia 1 (puree);Nectar-thick liquid Liquids provided via: Cup;No straw Medication Administration: Whole meds with puree Supervision: Full supervision/cueing for compensatory  strategies;Staff to assist with self feeding;Patient able to self feed Compensations: Slow rate;Small sips/bites Postural Changes and/or Swallow Maneuvers: Seated upright 90 degrees              Oral Care Recommendations: Oral care BID Follow up Recommendations:  (TBD) Plan: Continue with current plan of care         [name], [name] 09/15/2014, 10:26 AM  Breck CoonsLisa Snow Lonell FaceLitaker M.Ed [employer]-SLP Pager [phone]

## 2014-09-16 DIAGNOSIS — R7881 Bacteremia: Secondary | ICD-10-CM

## 2014-09-16 LAB — CULTURE, BLOOD (ROUTINE X 2)

## 2014-09-16 LAB — COMPREHENSIVE METABOLIC PANEL
ALT: 123 U/L — ABNORMAL HIGH (ref 0–53)
ANION GAP: 16 — AB (ref 5–15)
AST: 177 U/L — ABNORMAL HIGH (ref 0–37)
Albumin: 2.1 g/dL — ABNORMAL LOW (ref 3.5–5.2)
Alkaline Phosphatase: 65 U/L (ref 39–117)
BUN: 27 mg/dL — AB (ref 6–23)
CO2: 23 mEq/L (ref 19–32)
Calcium: 8.5 mg/dL (ref 8.4–10.5)
Chloride: 97 mEq/L (ref 96–112)
Creatinine, Ser: 0.83 mg/dL (ref 0.50–1.35)
GFR calc non Af Amer: 83 mL/min — ABNORMAL LOW (ref >90)
GLUCOSE: 93 mg/dL (ref 70–99)
Potassium: 4.8 mEq/L (ref 3.7–5.3)
Sodium: 136 mEq/L — ABNORMAL LOW (ref 137–147)
TOTAL PROTEIN: 5.6 g/dL — AB (ref 6.0–8.3)
Total Bilirubin: 0.8 mg/dL (ref 0.3–1.2)

## 2014-09-16 LAB — CBC
HCT: 35.9 % — ABNORMAL LOW (ref 39.0–52.0)
Hemoglobin: 11.6 g/dL — ABNORMAL LOW (ref 13.0–17.0)
MCH: 29.1 pg (ref 26.0–34.0)
MCHC: 32.3 g/dL (ref 30.0–36.0)
MCV: 90.2 fL (ref 78.0–100.0)
Platelets: 91 10*3/uL — ABNORMAL LOW (ref 150–400)
RBC: 3.98 MIL/uL — AB (ref 4.22–5.81)
RDW: 17.2 % — ABNORMAL HIGH (ref 11.5–15.5)
WBC: 7.1 10*3/uL (ref 4.0–10.5)

## 2014-09-16 LAB — GLUCOSE, CAPILLARY
Glucose-Capillary: 98 mg/dL (ref 70–99)
Glucose-Capillary: 145 mg/dL — ABNORMAL HIGH (ref 70–99)

## 2014-09-16 MED ORDER — CIPROFLOXACIN HCL 500 MG PO TABS: 500.0000 mg | ORAL_TABLET | Freq: Two times a day (BID) | ORAL | Status: DC

## 2014-09-16 NOTE — Plan of Care (Signed)
Problem: Phase II Progression Outcomes Goal: Progress activity as tolerated unless otherwise ordered Outcome: Progressing     

## 2014-09-16 NOTE — Progress Notes (Signed)
Report given to PTAR 

## 2014-09-16 NOTE — Discharge Summary (Signed)
Physician Discharge Summary  [name] UJW:[id number] DOB: [birth year] DOA: 09/09/2014  PCP: Kevin German, MD  Admit date: 09/09/2014 Discharge date: 09/16/2014  Time spent: 40 minutes  Recommendations for Outpatient Follow-up:  1. Follow-up with primary care physician within one week. 2. Assess for restarting aspirin and Plavix, held because of recent GI bleed.  Discharge Diagnoses:  Principal Problem:   Acute blood loss anemia Active Problems:   Diabetes mellitus   End of life care   History of CVA (cerebrovascular accident)   Acute respiratory failure with hypoxia   Hemorrhagic shock   Acute encephalopathy   GI bleed   Acute renal failure   Elevated troponin   Bacteremia, escherichia coli   Discharge Condition: stable  Diet recommendation: dysphagia 1 with nectar thick liquids.  Filed Weights   09/14/14 0646 09/15/14 0445 09/16/14 0500  Weight: 48.3 kg (106 lb 7.7 oz) 48.2 kg (106 lb 4.2 oz) 46.1 kg (101 lb 10.1 oz)    History of present illness:  [name] is a 79 y.o. male, with past medical history significant for diabetes mellitus hypertension and coronary artery disease in addition to multiple strokes , recent admission in September 2015, with hemoglobin of 6.4 , which required transfusion , with negative Hemoccult done them, patient presents today with altered mental status, lethargy, workup did show hemoglobin of 4.9, heart failure with a creatinine of 1.6, elevated troponin, and hypernatremia of 155 , patient was Hemoccult positive, was unable to protect his airways, critical care were consulted, but patient was made DO NOT RESUSCITATE by the family at bedside, with family wishes for no aggressive measures, as for now just to continue with blood transfusions, fluids, antibiotics, but they didn't want any central lines, pressors, antiarrhythmics.  Hospital Course:   Escherichia coli bacteremia/sepsis One out of 2 blood cultures positive for Escherichia  coli, likely secondary to UTI. Patient was on Rocephin, switched to Cipro and then Zosyn when the culture turned out positive. Escherichia coli is susceptible to ciprofloxacin, treat for total of 14 days. 7 more days of Cipro on discharge.  Acute blood loss anemia Presented with hemoglobin of 4.9, status post transfusion of 4 units of packed RBCs. S/p 4U PRBC, no signs of bleeding. Follow-up hemoglobin on 11/1 is 11.2, check CBC in a.m. Again.  E Coli UTI Pansensitive Escherichia coli, patient is on Rocephin, switched to ciprofloxacin on DC.  Hemorrhagic shock/shock liver Resolving after transfusion -LFTS up from hypoperfusion. Check CMP in a.m.  GI bleed Hemoccult positive at presentation without signs of obvious active bleeding - GI feel endoscopic evaluation inappropriate - suspect etiology likely AVMs as per previous EGDs - allow diet as tolerated. Continue PPIs  Thrombocytopenia Due to consumption and UTI-will not follow closely since comfort is focus  Goals of care Discussed with the patient 2 daughters [name] and [name], patient is only DNR/DNI. Daughters reported they wanted everything to be done except resuscitation, intubation or central lines. There are okay with antibiotics, transfusion, medication intervention. They do not want hospice or palliative care.  Acute respiratory failure with hypoxia Initial chest x-ray unremarkable and suspect hypoxemia primarily related to altered mentation and hypoventilation-now stable on room air  Acute renal failure/hyponatremia/Dehydration Baseline renal function normal in September creatinine of 0.69 - at presentation BUN 55 and creatinine 1.44. Likely secondary to hypoperfusion, started on IV fluids. This is resolved after IV fluid hydration.creatinine on discharge is 0.8.  Hypokalemia Repleted orally.  Elevated troponin Secondary to demand ischemia from acute blood loss  anemia with associated hypoperfusion and  hypotension. No aspirin secondary to current bleeding.  Diabetes mellitus Recent issues with hypoglycemia at presentation and since admission. Actos is continued on day of discharge because of history of hypoglycemic episodes.  History of CVA  Recent CT of the head at admission consistent with prior CVA  Acute encephalopathy Multifactorial due to anemia with hypotension, acute renal failure, hypoglycemia and urinary tract infection  Dysphagia Seen by SLP and recommended dysphagia 1 with nectar thick liquids.   Procedures:  none  Consultations:  PCCM  Discharge Exam: Filed Vitals:   09/16/14 0825  BP: 158/108  Pulse:   Temp:   Resp:    General: Alert and awake, oriented x2, not to time, not in any acute distress. HEENT: anicteric sclera, pupils reactive to light and accommodation, EOMI CVS: S1-S2 clear, no murmur rubs or gallops Chest: clear to auscultation bilaterally, no wheezing, rales or rhonchi Abdomen: soft nontender, nondistended, normal bowel sounds, no organomegaly Extremities: bilateral amputee Neuro: Cranial nerves II-XII intact, no focal neurological deficits   Discharge Instructions You were cared for by a hospitalist during your hospital stay. If you have any questions about your discharge medications or the care you received while you were in the hospital after you are discharged, you can call the unit and asked to speak with the hospitalist on call if the hospitalist that took care of you is not available. Once you are discharged, your primary care physician will handle any further medical issues. Please note that NO REFILLS for any discharge medications will be authorized once you are discharged, as it is imperative that you return to your primary care physician (or establish a relationship with a primary care physician if you do not have one) for your aftercare needs so that they can reassess your need for medications and monitor your lab  values.  Discharge Instructions    Diet - low sodium heart healthy    Complete by:  As directed      Increase activity slowly    Complete by:  As directed      Other Restrictions    Complete by:  As directed   Do not take aspirin or Plavix for 1 week.          Current Discharge Medication List    START taking these medications   Details  ciprofloxacin (CIPRO) 500 MG tablet Take 1 tablet (500 mg total) by mouth 2 (two) times daily. Qty: 14 tablet, Refills: 0      CONTINUE these medications which have NOT CHANGED   Details  atorvastatin (LIPITOR) 80 MG tablet Take 80 mg by mouth daily.      cyanocobalamin (,VITAMIN B-12,) 1000 MCG/ML injection Inject 1 mL (1,000 mcg total) into the muscle every 30 (thirty) days. Qty: 1 mL, Refills: 0   Associated Diagnoses: Anemia, unspecified    metoprolol succinate (TOPROL-XL) 25 MG 24 hr tablet Take 25 mg by mouth daily.    traMADol (ULTRAM) 50 MG tablet Take 50 mg by mouth every 6 (six) hours as needed (pain).       STOP taking these medications     aspirin EC 81 MG tablet      clopidogrel (PLAVIX) 75 MG tablet      pioglitazone (ACTOS) 45 MG tablet        No Known Allergies    The results of significant diagnostics from this hospitalization (including imaging, microbiology, ancillary and laboratory) are listed below for reference.  Significant Diagnostic Studies: Dg Chest 1 View  09/09/2014   CLINICAL DATA:  Altered mental status.  EXAM: CHEST - 1 VIEW  COMPARISON:  July 22, 2014.  FINDINGS: The heart size and mediastinal contours are within normal limits. Both lungs are clear. No pneumothorax or pleural effusion is noted. Degenerative changes seen involving the left glenohumeral joint with subacromial narrowing suggesting rotator cuff injury.  IMPRESSION: No acute cardiopulmonary abnormality seen.   Electronically Signed   By: Roque Lias M.D.   On: 09/09/2014 16:56   Ct Head Wo Contrast  09/09/2014   CLINICAL DATA:   Progressive worsening of mental status.  EXAM: CT HEAD WITHOUT CONTRAST  TECHNIQUE: Contiguous axial images were obtained from the base of the skull through the vertex without intravenous contrast.  COMPARISON:  07/20/2014  FINDINGS: There is a large area of encephalomalacia involving the right parietal lobe and right occipital lobe compatible with previous right MCA infarct. Chronic left cerebellar infarct is also identified. There is prominence of the sulci and ventricles compatible with brain atrophy. There is no evidence for acute brain infarct, hemorrhage or intracranial mass. No abnormal extra-axial fluid collections identified. The paranasal sinuses appear clear. The mastoid air cells are also clear. The calvarium is intact.  IMPRESSION: 1. No acute intracranial abnormalities. 2. Chronic bilateral infarcts.   Electronically Signed   By: Signa Kell M.D.   On: 09/09/2014 18:16   Dg Chest Port 1v Same Day  09/13/2014   CLINICAL DATA:  Chest pain  EXAM: PORTABLE CHEST - 1 VIEW SAME DAY  COMPARISON:  09/09/2014  FINDINGS: Upper normal heart size. Hyperaeration. Stable right paratracheal density at the thoracic inlet likely related to ectatic vasculature. Hazy density at the left base worrisome for airspace disease or pleural effusion. No pneumothorax.  IMPRESSION: Left basilar airspace disease versus small pleural effusion.   Electronically Signed   By: Maryclare Bean M.D.   On: 09/13/2014 11:57    Microbiology: Recent Results (from the past 240 hour(s))  Culture, blood (routine x 2)     Status: None   Collection Time: 09/09/14  4:00 PM  Result Value Ref Range Status   Specimen Description BLOOD LEFT ARM  Final   Special Requests BOTTLES DRAWN AEROBIC ONLY 5CC  Final   Culture  Setup Time   Final    09/09/2014 22:39 Performed at [facility]    Culture   Final    NO GROWTH 5 DAYS Performed at [facility]    Report Status 09/15/2014 FINAL  Final  Culture, blood (routine x 2)      Status: None   Collection Time: 09/09/14  4:30 PM  Result Value Ref Range Status   Specimen Description BLOOD LEFT HAND  Final   Special Requests BOTTLES DRAWN AEROBIC AND ANAEROBIC 5CC  Final   Culture  Setup Time   Final    09/09/2014 22:32 Performed at [facility]    Culture   Final    ESCHERICHIA COLI Note: Gram Stain Report Called to,Read Back By and Verified With: ANGELO BRICKHOUSE 09/14/14 @ 8:40PM BY RUSCOE A. Performed at [facility]    Report Status 09/16/2014 FINAL  Final   Organism ID, Bacteria ESCHERICHIA COLI  Final      Susceptibility   Escherichia coli - MIC*    AMPICILLIN 4 SENSITIVE Sensitive     AMPICILLIN/SULBACTAM <=2 SENSITIVE Sensitive     CEFAZOLIN <=4 SENSITIVE Sensitive     CEFEPIME <=1 SENSITIVE  Sensitive     CEFTAZIDIME <=1 SENSITIVE Sensitive     CEFTRIAXONE <=1 SENSITIVE Sensitive     CIPROFLOXACIN <=0.25 SENSITIVE Sensitive     GENTAMICIN <=1 SENSITIVE Sensitive     IMIPENEM <=0.25 SENSITIVE Sensitive     PIP/TAZO <=4 SENSITIVE Sensitive     TOBRAMYCIN <=1 SENSITIVE Sensitive     TRIMETH/SULFA <=20 SENSITIVE Sensitive     * ESCHERICHIA COLI  Urine culture     Status: None   Collection Time: 09/09/14  5:52 PM  Result Value Ref Range Status   Specimen Description URINE, CATHETERIZED  Final   Special Requests NONE  Final   Culture  Setup Time   Final    09/10/2014 00:18 Performed at [facility]   Colony Count   Final    >=100,000 COLONIES/ML [facility]formed at [facility]   Culture   Final    ESCHERICHIA COLI Performed at [facility]   Report Status 09/12/2014 FINAL  Final   Organism ID, Bacteria ESCHERICHIA COLI  Final      Susceptibility   Escherichia coli - MIC*    AMPICILLIN 8 SENSITIVE Sensitive     CEFAZOLIN <=4 SENSITIVE Sensitive     CEFTRIAXONE <=1 SENSITIVE Sensitive     CIPROFLOXACIN <=0.25 SENSITIVE Sensitive     GENTAMICIN <=1 SENSITIVE Sensitive     LEVOFLOXACIN <=0.12 SENSITIVE  Sensitive     NITROFURANTOIN 32 SENSITIVE Sensitive     TOBRAMYCIN <=1 SENSITIVE Sensitive     TRIMETH/SULFA <=20 SENSITIVE Sensitive     PIP/TAZO <=4 SENSITIVE Sensitive     * ESCHERICHIA COLI  MRSA PCR Screening     Status: None   Collection Time: 09/09/14  8:30 PM  Result Value Ref Range Status   MRSA by PCR NEGATIVE NEGATIVE Final    Comment:        The GeneXpert MRSA Assay (FDA approved for NASAL specimens only), is one component of a comprehensive MRSA colonization surveillance program. It is not intended to diagnose MRSA infection nor to guide or monitor treatment for MRSA infections.  Culture, Urine     Status: None   Collection Time: 09/10/14  6:54 PM  Result Value Ref Range Status   Specimen Description URINE, RANDOM  Final   Special Requests NONE  Final   Culture  Setup Time   Final    09/11/2014 01:05 Performed at [facility]   Colony Count   Final    50,000 COLONIES/ML [facility]formed at [facility]   Culture   Final    ESCHERICHIA COLI Performed at [facility]   Report Status 09/13/2014 FINAL  Final   Organism ID, Bacteria ESCHERICHIA COLI  Final      Susceptibility   Escherichia coli - MIC*    AMPICILLIN 4 SENSITIVE Sensitive     CEFAZOLIN <=4 SENSITIVE Sensitive     CEFTRIAXONE <=1 SENSITIVE Sensitive     CIPROFLOXACIN <=0.25 SENSITIVE Sensitive     GENTAMICIN <=1 SENSITIVE Sensitive     LEVOFLOXACIN <=0.12 SENSITIVE Sensitive     NITROFURANTOIN <=16 SENSITIVE Sensitive     TOBRAMYCIN <=1 SENSITIVE Sensitive     TRIMETH/SULFA <=20 SENSITIVE Sensitive     PIP/TAZO <=4 SENSITIVE Sensitive     * ESCHERICHIA COLI     Labs: Basic Metabolic Panel:  Recent Labs Lab 09/11/14 0550 09/13/14 0635 09/14/14 0549 09/15/14 0503 09/16/14 0610  NA 152* 143 138 137 136*  K 3.3* 5.2 4.8 4.9 4.8  CL 116* 110 105 100 97  CO2 22 20 23 28 23   GLUCOSE 176* 94 156* 130* 93  BUN 51* 31* 28* 30* 27*  CREATININE 1.31 0.86 0.80 0.84 0.83   CALCIUM 8.1* 8.3* 8.2* 8.3* 8.5   Liver Function Tests:  Recent Labs Lab 09/11/14 0550 09/13/14 0635 09/14/14 0549 09/15/14 0503 09/16/14 0610  AST 192* 245* 196* 173* 177*  ALT 101* 146* 127* 118* 123*  ALKPHOS 82 87 70 63 65  BILITOT 0.5 0.7 0.5 0.7 0.8  PROT 5.2* 5.5* 5.2* 5.2* 5.6*  ALBUMIN 2.2* 2.1* 1.9* 1.9* 2.1*   No results for input(s): LIPASE, AMYLASE in the last 168 hours.  Recent Labs Lab 09/09/14 1429  AMMONIA 22   CBC:  Recent Labs Lab 09/09/14 1429  09/11/14 0550 09/13/14 0635 09/14/14 0549 09/15/14 0503 09/16/14 0610  WBC 12.4*  < > 11.9* 10.2 7.8 7.1 7.1  NEUTROABS 11.0*  --   --   --   --   --   --   HGB 4.9*  < > 11.5* 11.8* 11.2* 11.0* 11.6*  HCT 15.9*  < > 34.1* 36.6* 34.6* 34.1* 35.9*  MCV 97.5  < > 88.6 89.9 90.1 90.2 90.2  PLT 160  < > 81* 80* 84* 77* 91*  < > = values in this interval not displayed. Cardiac Enzymes:  Recent Labs Lab 09/09/14 1429 09/13/14 1247 09/13/14 1620 09/13/14 2242  TROPONINI 0.59* <0.30 <0.30 <0.30   BNP: BNP (last 3 results) No results for input(s): PROBNP in the last 8760 hours. CBG:  Recent Labs Lab 09/15/14 0806 09/15/14 1148 09/15/14 1700 09/15/14 2232 09/16/14 0800  GLUCAP 107* 147* 160* 160* 98       Signed:  Domenique Southers A  [facility] 09/16/2014, 11:44 AM

## 2014-09-16 NOTE — Clinical Social Work Note (Signed)
Per MD patient ready to DC back to home. Per RNCM patient's family requesting ambulance transport for patient to go home. RN, patient/family, and facility notified of patient's DC.  DC packet on patient's chart. Ambulance transport requested for patient (Services Request ID: [id number]). CSW signing off at this time.   [name] Tenna Lacko MSW, [place], [place], [phone]

## 2014-09-16 NOTE — Plan of Care (Signed)
Problem: Phase III Progression Outcomes Goal: Activity at appropriate level-compared to baseline (UP IN CHAIR FOR HEMODIALYSIS)  Outcome: Progressing     

## 2014-09-24 ENCOUNTER — Ambulatory Visit: Payer: Medicare Other | Attending: Internal Medicine | Admitting: Internal Medicine

## 2014-09-24 ENCOUNTER — Encounter: Payer: Self-pay | Admitting: Internal Medicine

## 2014-09-24 VITALS — BP 136/63 | HR 68 | Temp 98.0°F | Resp 15

## 2014-09-24 DIAGNOSIS — Z899 Acquired absence of limb, unspecified: Secondary | ICD-10-CM | POA: Diagnosis not present

## 2014-09-24 DIAGNOSIS — I739 Peripheral vascular disease, unspecified: Secondary | ICD-10-CM | POA: Diagnosis not present

## 2014-09-24 DIAGNOSIS — Z87891 Personal history of nicotine dependence: Secondary | ICD-10-CM | POA: Diagnosis not present

## 2014-09-24 DIAGNOSIS — I252 Old myocardial infarction: Secondary | ICD-10-CM | POA: Diagnosis not present

## 2014-09-24 DIAGNOSIS — E119 Type 2 diabetes mellitus without complications: Secondary | ICD-10-CM | POA: Diagnosis present

## 2014-09-24 DIAGNOSIS — I10 Essential (primary) hypertension: Secondary | ICD-10-CM | POA: Diagnosis not present

## 2014-09-24 DIAGNOSIS — I2510 Atherosclerotic heart disease of native coronary artery without angina pectoris: Secondary | ICD-10-CM | POA: Diagnosis not present

## 2014-09-24 DIAGNOSIS — D696 Thrombocytopenia, unspecified: Secondary | ICD-10-CM | POA: Diagnosis not present

## 2014-09-24 DIAGNOSIS — E139 Other specified diabetes mellitus without complications: Secondary | ICD-10-CM

## 2014-09-24 DIAGNOSIS — D62 Acute posthemorrhagic anemia: Secondary | ICD-10-CM

## 2014-09-24 DIAGNOSIS — E785 Hyperlipidemia, unspecified: Secondary | ICD-10-CM | POA: Insufficient documentation

## 2014-09-24 DIAGNOSIS — J449 Chronic obstructive pulmonary disease, unspecified: Secondary | ICD-10-CM | POA: Insufficient documentation

## 2014-09-24 DIAGNOSIS — K921 Melena: Secondary | ICD-10-CM | POA: Diagnosis not present

## 2014-09-24 DIAGNOSIS — I69254 Hemiplegia and hemiparesis following other nontraumatic intracranial hemorrhage affecting left non-dominant side: Secondary | ICD-10-CM | POA: Diagnosis not present

## 2014-09-24 DIAGNOSIS — Z8673 Personal history of transient ischemic attack (TIA), and cerebral infarction without residual deficits: Secondary | ICD-10-CM

## 2014-09-24 DIAGNOSIS — I639 Cerebral infarction, unspecified: Secondary | ICD-10-CM

## 2014-09-24 DIAGNOSIS — Z7982 Long term (current) use of aspirin: Secondary | ICD-10-CM | POA: Diagnosis not present

## 2014-09-24 LAB — CBC WITH DIFFERENTIAL/PLATELET
BASOS ABS: 0 10*3/uL (ref 0.0–0.1)
BASOS PCT: 0 % (ref 0–1)
EOS ABS: 0 10*3/uL (ref 0.0–0.7)
EOS PCT: 0 % (ref 0–5)
HEMATOCRIT: 36.9 % — AB (ref 39.0–52.0)
Hemoglobin: 12.2 g/dL — ABNORMAL LOW (ref 13.0–17.0)
Lymphocytes Relative: 7 % — ABNORMAL LOW (ref 12–46)
Lymphs Abs: 0.5 10*3/uL — ABNORMAL LOW (ref 0.7–4.0)
MCH: 29.3 pg (ref 26.0–34.0)
MCHC: 33.1 g/dL (ref 30.0–36.0)
MCV: 88.7 fL (ref 78.0–100.0)
MONO ABS: 0.5 10*3/uL (ref 0.1–1.0)
Monocytes Relative: 7 % (ref 3–12)
NEUTROS ABS: 6.4 10*3/uL (ref 1.7–7.7)
Neutrophils Relative %: 86 % — ABNORMAL HIGH (ref 43–77)
Platelets: 189 10*3/uL (ref 150–400)
RBC: 4.16 MIL/uL — ABNORMAL LOW (ref 4.22–5.81)
RDW: 16.4 % — ABNORMAL HIGH (ref 11.5–15.5)
WBC: 7.4 10*3/uL (ref 4.0–10.5)

## 2014-09-24 LAB — COMPLETE METABOLIC PANEL WITH GFR
ALK PHOS: 73 U/L (ref 39–117)
ALT: 66 U/L — ABNORMAL HIGH (ref 0–53)
AST: 56 U/L — AB (ref 0–37)
Albumin: 3.1 g/dL — ABNORMAL LOW (ref 3.5–5.2)
BILIRUBIN TOTAL: 1 mg/dL (ref 0.2–1.2)
BUN: 20 mg/dL (ref 6–23)
CO2: 24 mEq/L (ref 19–32)
CREATININE: 0.66 mg/dL (ref 0.50–1.35)
Calcium: 9 mg/dL (ref 8.4–10.5)
Chloride: 100 mEq/L (ref 96–112)
GFR, Est African American: >89 mL/min
Glucose, Bld: 112 mg/dL — ABNORMAL HIGH (ref 70–99)
POTASSIUM: 4.2 meq/L (ref 3.5–5.3)
Sodium: 136 mEq/L (ref 135–145)
Total Protein: 6.4 g/dL (ref 6.0–8.3)

## 2014-09-24 LAB — GLUCOSE, POCT (MANUAL RESULT ENTRY): POC Glucose: 106 mg/dl — AB (ref 70–99)

## 2014-09-24 NOTE — Patient Instructions (Signed)
Diabetes Mellitus and Food It is important for you to manage your blood sugar (glucose) level. Your blood glucose level can be greatly affected by what you eat. Eating healthier foods in the appropriate amounts throughout the day at about the same time each day will help you control your blood glucose level. It can also help slow or prevent worsening of your diabetes mellitus. Healthy eating may even help you improve the level of your blood pressure and reach or maintain a healthy weight.  HOW CAN FOOD AFFECT ME? Carbohydrates Carbohydrates affect your blood glucose level more than any other type of food. Your dietitian will help you determine how many carbohydrates to eat at each meal and teach you how to count carbohydrates. Counting carbohydrates is important to keep your blood glucose at a healthy level, especially if you are using insulin or taking certain medicines for diabetes mellitus. Alcohol Alcohol can cause sudden decreases in blood glucose (hypoglycemia), especially if you use insulin or take certain medicines for diabetes mellitus. Hypoglycemia can be a life-threatening condition. Symptoms of hypoglycemia (sleepiness, dizziness, and disorientation) are similar to symptoms of having too much alcohol.  If your health care provider has given you approval to drink alcohol, do so in moderation and use the following guidelines:  Women should not have more than one drink per day, and men should not have more than two drinks per day. One drink is equal to:  12 oz of beer.  5 oz of wine.  1 oz of hard liquor.  Do not drink on an empty stomach.  Keep yourself hydrated. Have water, diet soda, or unsweetened iced tea.  Regular soda, juice, and other mixers might contain a lot of carbohydrates and should be counted. WHAT FOODS ARE NOT RECOMMENDED? As you make food choices, it is important to remember that all foods are not the same. Some foods have fewer nutrients per serving than other  foods, even though they might have the same number of calories or carbohydrates. It is difficult to get your body what it needs when you eat foods with fewer nutrients. Examples of foods that you should avoid that are high in calories and carbohydrates but low in nutrients include:  Trans fats (most processed foods list trans fats on the Nutrition Facts label).  Regular soda.  Juice.  Candy.  Sweets, such as cake, pie, doughnuts, and cookies.  Fried foods. WHAT FOODS CAN I EAT? Have nutrient-rich foods, which will nourish your body and keep you healthy. The food you should eat also will depend on several factors, including:  The calories you need.  The medicines you take.  Your weight.  Your blood glucose level.  Your blood pressure level.  Your cholesterol level. You also should eat a variety of foods, including:  Protein, such as meat, poultry, fish, tofu, nuts, and seeds (lean animal proteins are best).  Fruits.  Vegetables.  Dairy products, such as milk, cheese, and yogurt (low fat is best).  Breads, grains, pasta, cereal, rice, and beans.  Fats such as olive oil, trans fat-free margarine, canola oil, avocado, and olives. DOES EVERYONE WITH DIABETES MELLITUS HAVE THE SAME MEAL PLAN? Because every person with diabetes mellitus is different, there is not one meal plan that works for everyone. It is very important that you meet with a dietitian who will help you create a meal plan that is just right for you. Document Released: 07/28/2005 Document Revised: 11/05/2013 Document Reviewed: 09/27/2013 Exit[employer] Patient Information 2015 [employer]. This   information is not intended to replace advice given to you by your health care provider. Make sure you discuss any questions you have with your health care provider.  

## 2014-09-24 NOTE — Progress Notes (Signed)
Patient here for hospital follow up Was admitted for anemia Has history of diabetes  Is temporarily off aspirin actos and plavix

## 2014-09-24 NOTE — Progress Notes (Signed)
Patient Demographics  Kevin Pentarthur Stallbaumer, is a 79 y.o. male  ZOX:[id number]  WJX:[id number]  DOB - Mar 18, 1935  CC:  Chief Complaint  Patient presents with  . Hospitalization Follow-up       HPI: [name] is a 79 y.o. male here today to establish medical care.Patient has history of diabetes hypertension CAD, multiple strokes, anemia, recently hospitalized with mental status lethargy patient found to have low hemoglobin level, CHF, elevated creatinine, troponin, hyponatremia, patient had Hemoccult blood positive, EMR reviewed patient was made DO NOT RESUSCITATE with no aggressive measures patient was given blood transfusion fluids antibiotic, patient was also treated for UTI initially IV antibiotics then subsequently discharged on Cipro, for anemia patient received a 4 units of packed RBCs, his hemoglobin level improved, patient didn't had Hemoccult blood positive, GI felt endoscopic evaluation was not appropriate was likely AVMs since he has had previous endoscopies done he was continued with PPI, patient also history of dysphagia was seen by speech and swallow, recommended her to nectar thick liquids, as per family have not noticed any blood in his stool. Patient is currently not taking Actos. Patient has No headache, No chest pain, No abdominal pain - No Nausea, No new weakness tingling or numbness, No Cough - SOB.  No Known Allergies Past Medical History  Diagnosis Date  . Diabetes mellitus   . Hypertension   . Myocardial infarction 2005  . Blood clot in vein   . Blindness temporary   . Hyperlipidemia   . CAD (coronary artery disease)   . Stroke 10/2008    resulting in left side weakness  . Protein calorie malnutrition 2011  . Atrial fibrillation 2010  . Gallstones 11/2010    seen on CT  . Compression fracture 11/2010    lumbar spine, seen on CT  . COPD (chronic obstructive pulmonary disease)   . Shortness of breath    Current Outpatient Prescriptions on File Prior to  Visit  Medication Sig Dispense Refill  . atorvastatin (LIPITOR) 80 MG tablet Take 80 mg by mouth daily.      . ciprofloxacin (CIPRO) 500 MG tablet Take 1 tablet (500 mg total) by mouth 2 (two) times daily. 14 tablet 0  . metoprolol succinate (TOPROL-XL) 25 MG 24 hr tablet Take 25 mg by mouth daily.    . cyanocobalamin (,VITAMIN B-12,) 1000 MCG/ML injection Inject 1 mL (1,000 mcg total) into the muscle every 30 (thirty) days. 1 mL 0  . traMADol (ULTRAM) 50 MG tablet Take 50 mg by mouth every 6 (six) hours as needed (pain).      No current facility-administered medications on file prior to visit.   History reviewed. No pertinent family history. History   Social History  . Marital Status: Widowed    Spouse Name: N/A    Number of Children: N/A  . Years of Education: N/A   Occupational History  . Not on file.   Social History Main Topics  . Smoking status: Former Smoker    Types: Cigarettes    Quit date: 11/14/2002  . Smokeless tobacco: Never Used  . Alcohol Use: No  . Drug Use: No  . Sexual Activity: Not on file   Other Topics Concern  . Not on file   Social History Narrative    Review of Systems: Constitutional: Negative for fever, chills, diaphoresis, activity change, appetite change and fatigue. HENT: Negative for ear pain, nosebleeds, congestion, facial swelling, rhinorrhea, neck pain, neck stiffness and ear discharge.  Eyes: Negative  for pain, discharge, redness, itching and visual disturbance. Respiratory: Negative for cough, choking, chest tightness, shortness of breath, wheezing and stridor.  Cardiovascular: Negative for chest pain, palpitations and leg swelling. Gastrointestinal: Negative for abdominal distention. Genitourinary: Negative for dysuria, urgency, frequency, hematuria, flank pain, decreased urine volume, difficulty urinating and dyspareunia.  Musculoskeletal: Negative for back pain, joint swelling, arthralgia and gait problem. Neurological: Negative for  dizziness, tremors, seizures, syncope, facial asymmetry, speech difficulty, weakness, light-headedness, numbness and headaches.    Objective:   Filed Vitals:   09/24/14 1037  BP: 136/63  Pulse: 68  Temp: 98 F (36.7 C)  Resp: 15    Physical Exam: Constitutional: patient is in wheelchair not in acute distress . HENT: Normocephalic, atraumatic, External right and left ear normal. Oropharynx is clear and moist.  Eyes: Conjunctivae and EOM are normal. PERRLA, no scleral icterus. Neck: Normal ROM. Neck supple. No JVD. No tracheal deviation. No thyromegaly. CVS: RRR, S1/S2 +, no murmurs, no gallops, no carotid bruit.  Pulmonary: Effort and breath sounds normal, no stridor, rhonchi, wheezes, rales.  Abdominal: Soft. BS +, no distension, tenderness, rebound or guarding.  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    Lab Results  Component Value Date   WBC 7.1 09/16/2014   HGB 11.6* 09/16/2014   HCT 35.9* 09/16/2014   MCV 90.2 09/16/2014   PLT 91* 09/16/2014   Lab Results  Component Value Date   CREATININE 0.83 09/16/2014   BUN 27* 09/16/2014   NA 136* 09/16/2014   K 4.8 09/16/2014   CL 97 09/16/2014   CO2 23 09/16/2014    Lab Results  Component Value Date   HGBA1C 6.1* 07/21/2014   Lipid Panel     Component Value Date/Time   CHOL  04/13/2010 2228    122        ATP III CLASSIFICATION:  <200     mg/dL   Desirable  [id number]  mg/dL   Borderline High  >=841>=240    mg/dL   High          TRIG 40 04/13/2010 2228   HDL 54 04/13/2010 2228   CHOLHDL 2.3 04/13/2010 2228   VLDL 8 04/13/2010 2228   LDLCALC  04/13/2010 2228    60        Total Cholesterol/HDL:CHD Risk Coronary Heart Disease Risk Table                     Men   Women  1/2 Average Risk   3.4   3.3  Average Risk       5.0   4.4  2 X Average Risk   9.6   7.1  3 X Average Risk  23.4   11.0        Use the calculated Patient Ratio above and the CHD Risk Table to determine the patient's CHD  Risk.        ATP III CLASSIFICATION (LDL):  <100     mg/dL   Optimal  [id number]  mg/dL   Near or Above                    Optimal  130-159  mg/dL   Borderline  [id number]  mg/dL   High  >664>190     mg/dL   Very High       Assessment and plan:   1. Other specified diabetes mellitus without complications Results for orders placed or performed in visit on 09/24/14  Glucose (  CBG)  Result Value Ref Range   POC Glucose 106 (A) 70 - 99 mg/dl   Last hemoglobin Z6X was 6.1% to, due to hypoglycemia episodes actos was  discontinued, advised to continue with low carbohydrate diet we'll recheck A1c in 3 months.  - COMPLETE METABOLIC PANEL WITH GFR  2. PVD (peripheral vascular disease)/6. History of CVA (cerebrovascular accident)/7. Thrombocytopenia Currently patient is only on a statin, aspirin and Plavix was held during the hospitalization, since patient also history of thrombocytopenia , patient is scheduled to follow with  Hematology/oncology, the family wants to hold off any anti platelate agents  since he had a bleeding after he was started on aspirin  3. Gastrointestinal hemorrhage with melena/4. Acute blood loss anemia Will repeat CBC.  - CBC with Differential  5. Essential hypertension, benign Blood pressure is controlled continue with metoprolol . - COMPLETE METABOLIC PANEL WITH GFR   Return in about 2 months (around 11/24/2014) for diabetes, hypertension, hyperipidemia.   Doris Cheadle, MD

## 2014-09-25 ENCOUNTER — Telehealth: Payer: Self-pay

## 2014-09-25 NOTE — Telephone Encounter (Signed)
Pt returning call for nurse

## 2014-09-25 NOTE — Telephone Encounter (Signed)
-----   Message from Doris Cheadleeepak Advani, MD sent at 09/25/2014  1:08 PM EST ----- Call and let the patient know that his blood work shows improvement  his hemoglobin is stable

## 2014-09-25 NOTE — Telephone Encounter (Signed)
Patient unavailable Not able to leave message No voice mail set up

## 2014-09-26 ENCOUNTER — Telehealth: Payer: Self-pay

## 2014-09-26 ENCOUNTER — Telehealth: Payer: Self-pay | Admitting: Emergency Medicine

## 2014-09-26 NOTE — Telephone Encounter (Signed)
Pt daughter [name] given negative lab results

## 2014-09-26 NOTE — Telephone Encounter (Signed)
Tried to return daughters phone call Number no longer in service Unable to leave message

## 2014-10-27 ENCOUNTER — Other Ambulatory Visit: Payer: Medicare Other

## 2014-10-27 ENCOUNTER — Telehealth: Payer: Self-pay | Admitting: Hematology and Oncology

## 2014-10-27 ENCOUNTER — Ambulatory Visit: Payer: Medicare Other | Admitting: Hematology and Oncology

## 2014-10-27 NOTE — Telephone Encounter (Signed)
, °

## 2014-10-31 ENCOUNTER — Telehealth: Payer: Self-pay | Admitting: Hematology and Oncology

## 2014-10-31 NOTE — Telephone Encounter (Signed)
, °

## 2014-11-11 ENCOUNTER — Other Ambulatory Visit: Payer: Medicare Other

## 2014-11-11 ENCOUNTER — Ambulatory Visit: Payer: Medicare Other | Admitting: Hematology and Oncology

## 2014-11-18 ENCOUNTER — Ambulatory Visit: Payer: Medicare Other | Admitting: Hematology and Oncology

## 2014-11-18 ENCOUNTER — Other Ambulatory Visit: Payer: Medicare Other

## 2014-11-19 ENCOUNTER — Telehealth: Payer: Self-pay | Admitting: Internal Medicine

## 2014-11-19 NOTE — Telephone Encounter (Signed)
Pt following up on refill request for Tramadol.  Pt says he is in a lot of pain and has not had medication for one month, please f/u with pt.

## 2014-11-20 ENCOUNTER — Other Ambulatory Visit: Payer: Self-pay | Admitting: Internal Medicine

## 2014-11-20 ENCOUNTER — Ambulatory Visit (HOSPITAL_BASED_OUTPATIENT_CLINIC_OR_DEPARTMENT_OTHER): Payer: Medicare Other | Admitting: Hematology and Oncology

## 2014-11-20 ENCOUNTER — Telehealth: Payer: Self-pay | Admitting: Hematology and Oncology

## 2014-11-20 ENCOUNTER — Other Ambulatory Visit (HOSPITAL_BASED_OUTPATIENT_CLINIC_OR_DEPARTMENT_OTHER): Payer: Medicare Other

## 2014-11-20 VITALS — BP 123/63 | HR 67 | Temp 97.4°F | Resp 18 | Ht <= 58 in | Wt 80.2 lb

## 2014-11-20 DIAGNOSIS — D62 Acute posthemorrhagic anemia: Secondary | ICD-10-CM

## 2014-11-20 DIAGNOSIS — E876 Hypokalemia: Secondary | ICD-10-CM

## 2014-11-20 DIAGNOSIS — D519 Vitamin B12 deficiency anemia, unspecified: Secondary | ICD-10-CM | POA: Diagnosis present

## 2014-11-20 LAB — COMPREHENSIVE METABOLIC PANEL (CC13)
ALBUMIN: 3 g/dL — AB (ref 3.5–5.0)
ALT: 56 U/L — ABNORMAL HIGH (ref 0–55)
ANION GAP: 11 meq/L (ref 3–11)
AST: 55 U/L — AB (ref 5–34)
Alkaline Phosphatase: 132 U/L (ref 40–150)
BUN: 19 mg/dL (ref 7.0–26.0)
CHLORIDE: 104 meq/L (ref 98–109)
CO2: 27 mEq/L (ref 22–29)
CREATININE: 0.7 mg/dL (ref 0.7–1.3)
Calcium: 8.6 mg/dL (ref 8.4–10.4)
EGFR: >90 mL/min/{1.73_m2} (ref >90)
GLUCOSE: 87 mg/dL (ref 70–140)
Potassium: 2.9 mEq/L — CL (ref 3.5–5.1)
Sodium: 142 mEq/L (ref 136–145)
Total Bilirubin: 0.94 mg/dL (ref 0.20–1.20)
Total Protein: 6.1 g/dL — ABNORMAL LOW (ref 6.4–8.3)

## 2014-11-20 LAB — CBC WITH DIFFERENTIAL/PLATELET
BASO%: 0.5 % (ref 0.0–2.0)
Basophils Absolute: 0 10*3/uL (ref 0.0–0.1)
EOS%: 0.1 % (ref 0.0–7.0)
Eosinophils Absolute: 0 10*3/uL (ref 0.0–0.5)
HCT: 32.9 % — ABNORMAL LOW (ref 38.4–49.9)
HGB: 10.4 g/dL — ABNORMAL LOW (ref 13.0–17.1)
LYMPH%: 9.4 % — ABNORMAL LOW (ref 14.0–49.0)
MCH: 28.8 pg (ref 27.2–33.4)
MCHC: 31.6 g/dL — ABNORMAL LOW (ref 32.0–36.0)
MCV: 90.9 fL (ref 79.3–98.0)
MONO#: 0.6 10*3/uL (ref 0.1–0.9)
MONO%: 8.4 % (ref 0.0–14.0)
NEUT%: 81.6 % — ABNORMAL HIGH (ref 39.0–75.0)
NEUTROS ABS: 5.6 10*3/uL (ref 1.5–6.5)
Platelets: 101 10*3/uL — ABNORMAL LOW (ref 140–400)
RBC: 3.62 10*6/uL — AB (ref 4.20–5.82)
RDW: 17.8 % — AB (ref 11.0–14.6)
WBC: 6.9 10*3/uL (ref 4.0–10.3)
lymph#: 0.7 10*3/uL — ABNORMAL LOW (ref 0.9–3.3)

## 2014-11-20 MED ORDER — NEEDLES & SYRINGES MISC: 1.0000 | Status: DC

## 2014-11-20 MED ORDER — POTASSIUM CHLORIDE CRYS ER 20 MEQ PO TBCR: 20.0000 meq | EXTENDED_RELEASE_TABLET | Freq: Every day | ORAL | Status: DC

## 2014-11-20 MED ORDER — CYANOCOBALAMIN 1000 MCG/ML IJ SOLN: 1000.0000 ug | INTRAMUSCULAR | Status: AC

## 2014-11-20 MED ORDER — TRAMADOL HCL 50 MG PO TABS: 50.0000 mg | ORAL_TABLET | Freq: Four times a day (QID) | ORAL | Status: DC | PRN

## 2014-11-20 MED ORDER — NEEDLES & SYRINGES MISC: 1.0000 | Status: AC

## 2014-11-20 NOTE — Assessment & Plan Note (Signed)
Normochromic normocytic anemia: Probably multifactorial with severe B-12 deficiency currently on B-12 supplementation once a month. His hemoglobin had improved from a level of 9 on the way up to 12.2. Today's hemoglobin is 10.4 g. I recommended continuing the same treatment and monitoring his hemoglobin levels periodically.  Diabetes with distal peripheral ischemia/neuropathy: Patient has an appointment to see his primary care doctor next week. They will discuss with primary care physician how to best take care of his diabetes as well as his peripheral ischemic changes in his hands.  Hypokalemia: I provided her with a prescription for oral potassium replacement. He has very poor performance status and hence I do not plan on following any bone marrow biopsies and her care will be primarily for supportive reasons to prevent severe anemia. Patient's family would like to see if they can get the B-12 injections for outpatient administration by the results. I provided them with a prescription for B-12 along with syringes. Return to clinic in 6 months for follow-up.

## 2014-11-20 NOTE — Telephone Encounter (Signed)
Last Refill on 06/19/2014 per [facility] pharmacy Rx refills, Pt daughter aware. Rx at front office     Doris Cheadleeepak Advani, MD at 11/20/2014 9:25 AM    Status: Signed      Expand All Collapse All    Check with the patient/pharmacy when the prescription was done and which pharmacy the medication was filled , then patient can be given refill

## 2014-11-20 NOTE — Progress Notes (Signed)
Patient Care Team: Doris Cheadleeepak Advani, MD as PCP - General (Internal Medicine)  DIAGNOSIS: Normochromic normocytic anemia multifactorial with severe B-12 deficiency  Current treatment: Monthly 1000 g B-12 injections CHIEF COMPLIANT: Wheelchair-bound. Digital ischemic changes  INTERVAL HISTORY: [name] is a 80 year old woman who I saw in the hospital for anemia and workup did not reveal a clear-cut etiology other than low B-12 levels. [name] has been started on B-12 supplementation and since then [name] has been discharged and his hemoglobin levels, as much as 12.2 g. His platelets have also normalized. [name] is diabetic and is not on any diabetic medications. They have an appointment to speak with the primary care physician few days. They're also reporting his upper extremities appear to be cold clammy and one of the fingers appears to have ischemic changes with bluish discoloration. [name] does not complain of any pain or discomfort in his fingers.  REVIEW OF SYSTEMS:   Constitutional: Denies fevers, chills or abnormal weight loss Eyes: Denies blurriness of vision Respiratory: Shallow breathing Cardiovascular: Denies palpitation, chest discomfort or lower extremity swelling Gastrointestinal:  Denies nausea, heartburn or change in bowel habits Skin: Digits showing some ischemic changes with bluish discoloration of his left ring finger Neurological: Distal peripheral neuropathy  All other systems were reviewed with the patient and are negative.  I have reviewed the past medical history, past surgical history, social history and family history with the patient and they are unchanged from previous note.  ALLERGIES:  has No Known Allergies.  MEDICATIONS:  Current Outpatient Prescriptions  Medication Sig Dispense Refill  . clopidogrel (PLAVIX) 75 MG tablet Take 75 mg by mouth daily.  4  . traMADol (ULTRAM) 50 MG tablet Take 1 tablet (50 mg total) by mouth every 6 (six) hours as needed (pain). 30 tablet 0   . atorvastatin (LIPITOR) 80 MG tablet Take 80 mg by mouth daily.      . ciprofloxacin (CIPRO) 500 MG tablet Take 1 tablet (500 mg total) by mouth 2 (two) times daily. (Patient not taking: Reported on 11/20/2014) 14 tablet 0  . cyanocobalamin (,VITAMIN B-12,) 1000 MCG/ML injection Inject 1 mL (1,000 mcg total) into the muscle every 30 (thirty) days. 12 mL 1  . metoprolol succinate (TOPROL-XL) 25 MG 24 hr tablet Take 25 mg by mouth daily.    . Needles & Syringes MISC 1 Syringe by Does not apply route every 30 (thirty) days. 12 each 1  . pioglitazone (ACTOS) 45 MG tablet Take 45 mg by mouth daily.  4   No current facility-administered medications for this visit.    PHYSICAL EXAMINATION: ECOG PERFORMANCE STATUS: 3 - Symptomatic, >50% confined to bed  Filed Vitals:   11/20/14 1527  BP: 123/63  Pulse: 67  Temp: 97.4 F (36.3 C)  Resp: 18   Filed Weights   11/20/14 1527  Weight: 80 lb 3.2 oz (36.378 kg)    GENERAL:alert, no distress and comfortable EYES: normal, Conjunctiva are pink and non-injected, sclera clear OROPHARYNX:no exudate, no erythema and lips, buccal mucosa, and tongue normal  LUNGS: Shallow breathing HEART: regular rate & rhythm and no murmurs and no lower extremity edema ABDOMEN:abdomen soft, non-tender and normal bowel sounds Musculoskeletal: Left ring finger cyanosis NEURO: Peripheral neuropathy   LABORATORY DATA:  I have reviewed the data as listed   Chemistry      Component Value Date/Time   NA 142 11/20/2014 1513   NA 136 09/24/2014 1136   K 2.9* 11/20/2014 1513   K 4.2 09/24/2014 1136  CL 100 09/24/2014 1136   CO2 27 11/20/2014 1513   CO2 24 09/24/2014 1136   BUN 19.0 11/20/2014 1513   BUN 20 09/24/2014 1136   CREATININE 0.7 11/20/2014 1513   CREATININE 0.66 09/24/2014 1136   CREATININE 0.83 09/16/2014 0610      Component Value Date/Time   CALCIUM 8.6 11/20/2014 1513   CALCIUM 9.0 09/24/2014 1136   ALKPHOS 132 11/20/2014 1513   ALKPHOS 73  09/24/2014 1136   AST 55* 11/20/2014 1513   AST 56* 09/24/2014 1136   ALT 56* 11/20/2014 1513   ALT 66* 09/24/2014 1136   BILITOT 0.94 11/20/2014 1513   BILITOT 1.0 09/24/2014 1136       Lab Results  Component Value Date   WBC 6.9 11/20/2014   HGB 10.4* 11/20/2014   HCT 32.9* 11/20/2014   MCV 90.9 11/20/2014   PLT 101* 11/20/2014   NEUTROABS 5.6 11/20/2014   ASSESSMENT & PLAN:  B12 deficiency anemia Normochromic normocytic anemia: Probably multifactorial with severe B-12 deficiency currently on B-12 supplementation once a month. His hemoglobin had improved from a level of 9 on the way up to 12.2. Today's hemoglobin is 10.4 g. I recommended continuing the same treatment and monitoring his hemoglobin levels periodically.  Diabetes with distal peripheral ischemia/neuropathy: Patient has an appointment to see his primary care doctor next week. They will discuss with primary care physician how to best take care of his diabetes as well as his peripheral ischemic changes in his hands.  Hypokalemia: I provided her with a prescription for oral potassium replacement. [name] has very poor performance status and hence I do not plan on following any bone marrow biopsies and her care will be primarily for supportive reasons to prevent severe anemia. Patient's family would like to see if they can get the B-12 injections for outpatient administration by the results. I provided them with a prescription for B-12 along with syringes. Return to clinic in 6 months for follow-up.   No orders of the defined types were placed in this encounter.    Sabas Sous, MD 11/20/2014 3:55 PM

## 2014-11-20 NOTE — Telephone Encounter (Signed)
gv relative appt schedule for july °

## 2014-11-20 NOTE — Telephone Encounter (Signed)
Patients daughter is calling to check on the status of the med refill request for Tramadol for her father, daughter states that her father is in a lot of pain and has not herd anything back in a while. She would like to speak to a nurse. Please f/u with family member.

## 2014-11-20 NOTE — Telephone Encounter (Signed)
Check with the patient/pharmacy when the prescription was done and which pharmacy the medication was filled , then patient can be given refill.

## 2014-11-20 NOTE — Telephone Encounter (Signed)
Please call family member back at [phone].

## 2014-11-27 ENCOUNTER — Other Ambulatory Visit: Payer: Self-pay

## 2014-11-27 ENCOUNTER — Ambulatory Visit: Payer: Medicare Other | Attending: Internal Medicine | Admitting: Internal Medicine

## 2014-11-27 ENCOUNTER — Encounter: Payer: Self-pay | Admitting: Internal Medicine

## 2014-11-27 VITALS — BP 108/68 | HR 60 | Temp 97.6°F | Resp 15

## 2014-11-27 DIAGNOSIS — Z89612 Acquired absence of left leg above knee: Secondary | ICD-10-CM | POA: Diagnosis not present

## 2014-11-27 DIAGNOSIS — Z8673 Personal history of transient ischemic attack (TIA), and cerebral infarction without residual deficits: Secondary | ICD-10-CM

## 2014-11-27 DIAGNOSIS — Z951 Presence of aortocoronary bypass graft: Secondary | ICD-10-CM | POA: Diagnosis not present

## 2014-11-27 DIAGNOSIS — Z89611 Acquired absence of right leg above knee: Secondary | ICD-10-CM | POA: Diagnosis not present

## 2014-11-27 DIAGNOSIS — L608 Other nail disorders: Secondary | ICD-10-CM | POA: Insufficient documentation

## 2014-11-27 DIAGNOSIS — I2510 Atherosclerotic heart disease of native coronary artery without angina pectoris: Secondary | ICD-10-CM | POA: Insufficient documentation

## 2014-11-27 DIAGNOSIS — I252 Old myocardial infarction: Secondary | ICD-10-CM | POA: Insufficient documentation

## 2014-11-27 DIAGNOSIS — M79644 Pain in right finger(s): Secondary | ICD-10-CM | POA: Insufficient documentation

## 2014-11-27 DIAGNOSIS — R230 Cyanosis: Secondary | ICD-10-CM

## 2014-11-27 DIAGNOSIS — E785 Hyperlipidemia, unspecified: Secondary | ICD-10-CM | POA: Insufficient documentation

## 2014-11-27 DIAGNOSIS — Z87891 Personal history of nicotine dependence: Secondary | ICD-10-CM | POA: Insufficient documentation

## 2014-11-27 DIAGNOSIS — I69354 Hemiplegia and hemiparesis following cerebral infarction affecting left non-dominant side: Secondary | ICD-10-CM | POA: Diagnosis not present

## 2014-11-27 DIAGNOSIS — E11649 Type 2 diabetes mellitus with hypoglycemia without coma: Secondary | ICD-10-CM | POA: Diagnosis present

## 2014-11-27 DIAGNOSIS — E538 Deficiency of other specified B group vitamins: Secondary | ICD-10-CM | POA: Diagnosis not present

## 2014-11-27 DIAGNOSIS — E139 Other specified diabetes mellitus without complications: Secondary | ICD-10-CM

## 2014-11-27 DIAGNOSIS — I739 Peripheral vascular disease, unspecified: Secondary | ICD-10-CM | POA: Insufficient documentation

## 2014-11-27 DIAGNOSIS — I10 Essential (primary) hypertension: Secondary | ICD-10-CM | POA: Diagnosis not present

## 2014-11-27 DIAGNOSIS — Z792 Long term (current) use of antibiotics: Secondary | ICD-10-CM | POA: Diagnosis not present

## 2014-11-27 LAB — POCT GLYCOSYLATED HEMOGLOBIN (HGB A1C): Hemoglobin A1C: 5.9

## 2014-11-27 LAB — GLUCOSE, POCT (MANUAL RESULT ENTRY)
POC GLUCOSE: 32 mg/dL — AB (ref 70–99)
POC Glucose: 72 mg/dl (ref 70–99)

## 2014-11-27 MED ORDER — CEPHALEXIN 500 MG PO CAPS: 500.0000 mg | ORAL_CAPSULE | Freq: Three times a day (TID) | ORAL | Status: DC
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
## 2014-11-27 NOTE — Progress Notes (Signed)
Patient here for follow up on his diabetes and anemia Daughter states her dad is due for his B12 injection

## 2014-12-02 ENCOUNTER — Encounter (HOSPITAL_COMMUNITY): Payer: Self-pay | Admitting: Emergency Medicine

## 2014-12-02 ENCOUNTER — Emergency Department (HOSPITAL_COMMUNITY): Payer: Medicare Other

## 2014-12-02 ENCOUNTER — Inpatient Hospital Stay (HOSPITAL_COMMUNITY)
Admission: EM | Admit: 2014-12-02 | Discharge: 2014-12-04 | DRG: 539 | Disposition: A | Discharge status: Home / Self Care | Payer: Medicare Other | Attending: Internal Medicine | Admitting: Internal Medicine

## 2014-12-02 DIAGNOSIS — I70231 Atherosclerosis of native arteries of right leg with ulceration of thigh: Secondary | ICD-10-CM | POA: Diagnosis present

## 2014-12-02 DIAGNOSIS — Z7902 Long term (current) use of antithrombotics/antiplatelets: Secondary | ICD-10-CM | POA: Diagnosis not present

## 2014-12-02 DIAGNOSIS — J449 Chronic obstructive pulmonary disease, unspecified: Secondary | ICD-10-CM | POA: Diagnosis not present

## 2014-12-02 DIAGNOSIS — Z8673 Personal history of transient ischemic attack (TIA), and cerebral infarction without residual deficits: Secondary | ICD-10-CM

## 2014-12-02 DIAGNOSIS — I252 Old myocardial infarction: Secondary | ICD-10-CM | POA: Diagnosis not present

## 2014-12-02 DIAGNOSIS — Z89611 Acquired absence of right leg above knee: Secondary | ICD-10-CM

## 2014-12-02 DIAGNOSIS — Z7982 Long term (current) use of aspirin: Secondary | ICD-10-CM | POA: Diagnosis not present

## 2014-12-02 DIAGNOSIS — I739 Peripheral vascular disease, unspecified: Secondary | ICD-10-CM | POA: Diagnosis present

## 2014-12-02 DIAGNOSIS — T68XXXA Hypothermia, initial encounter: Secondary | ICD-10-CM

## 2014-12-02 DIAGNOSIS — R680 Hypothermia, not associated with low environmental temperature: Secondary | ICD-10-CM | POA: Diagnosis not present

## 2014-12-02 DIAGNOSIS — E162 Hypoglycemia, unspecified: Secondary | ICD-10-CM | POA: Diagnosis not present

## 2014-12-02 DIAGNOSIS — L97119 Non-pressure chronic ulcer of right thigh with unspecified severity: Secondary | ICD-10-CM | POA: Diagnosis not present

## 2014-12-02 DIAGNOSIS — E1140 Type 2 diabetes mellitus with diabetic neuropathy, unspecified: Secondary | ICD-10-CM | POA: Diagnosis present

## 2014-12-02 DIAGNOSIS — I10 Essential (primary) hypertension: Secondary | ICD-10-CM | POA: Diagnosis present

## 2014-12-02 DIAGNOSIS — Z87891 Personal history of nicotine dependence: Secondary | ICD-10-CM

## 2014-12-02 DIAGNOSIS — I480 Paroxysmal atrial fibrillation: Secondary | ICD-10-CM | POA: Diagnosis present

## 2014-12-02 DIAGNOSIS — E785 Hyperlipidemia, unspecified: Secondary | ICD-10-CM | POA: Diagnosis not present

## 2014-12-02 DIAGNOSIS — M869 Osteomyelitis, unspecified: Secondary | ICD-10-CM | POA: Diagnosis present

## 2014-12-02 DIAGNOSIS — E43 Unspecified severe protein-calorie malnutrition: Secondary | ICD-10-CM | POA: Diagnosis present

## 2014-12-02 DIAGNOSIS — T8740 Infection of amputation stump, unspecified extremity: Secondary | ICD-10-CM | POA: Diagnosis present

## 2014-12-02 DIAGNOSIS — D696 Thrombocytopenia, unspecified: Secondary | ICD-10-CM | POA: Diagnosis not present

## 2014-12-02 DIAGNOSIS — M79606 Pain in leg, unspecified: Secondary | ICD-10-CM

## 2014-12-02 DIAGNOSIS — M8660 Other chronic osteomyelitis, unspecified site: Principal | ICD-10-CM | POA: Diagnosis present

## 2014-12-02 DIAGNOSIS — Z23 Encounter for immunization: Secondary | ICD-10-CM

## 2014-12-02 DIAGNOSIS — I96 Gangrene, not elsewhere classified: Secondary | ICD-10-CM | POA: Diagnosis not present

## 2014-12-02 DIAGNOSIS — I2510 Atherosclerotic heart disease of native coronary artery without angina pectoris: Secondary | ICD-10-CM | POA: Diagnosis present

## 2014-12-02 DIAGNOSIS — E11649 Type 2 diabetes mellitus with hypoglycemia without coma: Secondary | ICD-10-CM | POA: Diagnosis present

## 2014-12-02 DIAGNOSIS — R531 Weakness: Secondary | ICD-10-CM

## 2014-12-02 DIAGNOSIS — T68XXXD Hypothermia, subsequent encounter: Secondary | ICD-10-CM

## 2014-12-02 DIAGNOSIS — E119 Type 2 diabetes mellitus without complications: Secondary | ICD-10-CM

## 2014-12-02 HISTORY — DX: Legal blindness, as defined in USA: H54.8

## 2014-12-02 LAB — CBG MONITORING, ED
Glucose-Capillary: 288 mg/dL — ABNORMAL HIGH (ref 70–99)
Glucose-Capillary: 96 mg/dL (ref 70–99)
Glucose-Capillary: 192 mg/dL — ABNORMAL HIGH (ref 70–99)

## 2014-12-02 LAB — I-STAT TROPONIN, ED: TROPONIN I, POC: 0.05 ng/mL (ref 0.00–0.08)

## 2014-12-02 LAB — CBC WITH DIFFERENTIAL/PLATELET
BASOS ABS: 0 10*3/uL (ref 0.0–0.1)
Basophils Relative: 0 % (ref 0–1)
EOS PCT: 0 % (ref 0–5)
Eosinophils Absolute: 0 10*3/uL (ref 0.0–0.7)
HEMATOCRIT: 31.3 % — AB (ref 39.0–52.0)
Hemoglobin: 10.3 g/dL — ABNORMAL LOW (ref 13.0–17.0)
LYMPHS ABS: 0.4 10*3/uL — AB (ref 0.7–4.0)
Lymphocytes Relative: 4 % — ABNORMAL LOW (ref 12–46)
MCH: 29.4 pg (ref 26.0–34.0)
MCHC: 32.9 g/dL (ref 30.0–36.0)
MCV: 89.4 fL (ref 78.0–100.0)
MONOS PCT: 3 % (ref 3–12)
Monocytes Absolute: 0.2 10*3/uL (ref 0.1–1.0)
Neutro Abs: 7.4 10*3/uL (ref 1.7–7.7)
Neutrophils Relative %: 93 % — ABNORMAL HIGH (ref 43–77)
Platelets: 97 10*3/uL — ABNORMAL LOW (ref 150–400)
RBC: 3.5 MIL/uL — ABNORMAL LOW (ref 4.22–5.81)
RDW: 16.5 % — ABNORMAL HIGH (ref 11.5–15.5)
WBC: 8 10*3/uL (ref 4.0–10.5)

## 2014-12-02 LAB — COMPREHENSIVE METABOLIC PANEL
ALT: 93 U/L — ABNORMAL HIGH (ref 0–53)
ANION GAP: 15 (ref 5–15)
AST: 108 U/L — AB (ref 0–37)
Albumin: 3.1 g/dL — ABNORMAL LOW (ref 3.5–5.2)
Alkaline Phosphatase: 122 U/L — ABNORMAL HIGH (ref 39–117)
BUN: 30 mg/dL — ABNORMAL HIGH (ref 6–23)
CALCIUM: 9 mg/dL (ref 8.4–10.5)
CO2: 24 mmol/L (ref 19–32)
CREATININE: 0.87 mg/dL (ref 0.50–1.35)
Chloride: 102 mEq/L (ref 96–112)
GFR calc Af Amer: >90 mL/min (ref >90)
GFR, EST NON AFRICAN AMERICAN: 81 mL/min — AB (ref >90)
GLUCOSE: 267 mg/dL — AB (ref 70–99)
POTASSIUM: 3.5 mmol/L (ref 3.5–5.1)
Sodium: 141 mmol/L (ref 135–145)
TOTAL PROTEIN: 6 g/dL (ref 6.0–8.3)
Total Bilirubin: 1.3 mg/dL — ABNORMAL HIGH (ref 0.3–1.2)

## 2014-12-02 LAB — URINALYSIS, ROUTINE W REFLEX MICROSCOPIC
Bilirubin Urine: NEGATIVE
Ketones, ur: NEGATIVE mg/dL
LEUKOCYTES UA: NEGATIVE
NITRITE: NEGATIVE
Protein, ur: 100 mg/dL — AB
SPECIFIC GRAVITY, URINE: 1.024 (ref 1.005–1.030)
Urobilinogen, UA: 1 mg/dL (ref 0.0–1.0)
pH: 5.5 (ref 5.0–8.0)

## 2014-12-02 LAB — GLUCOSE, CAPILLARY: Glucose-Capillary: 164 mg/dL — ABNORMAL HIGH (ref 70–99)

## 2014-12-02 LAB — CBC
HEMATOCRIT: 26.1 % — AB (ref 39.0–52.0)
HEMOGLOBIN: 8.6 g/dL — AB (ref 13.0–17.0)
MCH: 29.3 pg (ref 26.0–34.0)
MCHC: 33 g/dL (ref 30.0–36.0)
MCV: 88.8 fL (ref 78.0–100.0)
PLATELETS: 87 10*3/uL — AB (ref 150–400)
RBC: 2.94 MIL/uL — ABNORMAL LOW (ref 4.22–5.81)
RDW: 16.4 % — AB (ref 11.5–15.5)
WBC: 6.2 10*3/uL (ref 4.0–10.5)

## 2014-12-02 LAB — CREATININE, SERUM
CREATININE: 0.75 mg/dL (ref 0.50–1.35)
GFR calc Af Amer: >90 mL/min (ref >90)
GFR calc non Af Amer: 86 mL/min — ABNORMAL LOW (ref >90)

## 2014-12-02 LAB — URINE MICROSCOPIC-ADD ON

## 2014-12-02 MED ORDER — SODIUM CHLORIDE 0.9 % IV SOLN
INTRAVENOUS | Status: DC
  Administered 2014-12-02 – 2014-12-03 (×2): via INTRAVENOUS

## 2014-12-02 MED ORDER — SODIUM CHLORIDE 0.9 % IV BOLUS (SEPSIS)
1000.0000 mL | Freq: Once | INTRAVENOUS | Status: AC
  Administered 2014-12-02: 1000 mL via INTRAVENOUS

## 2014-12-02 MED ORDER — VANCOMYCIN HCL IN DEXTROSE 1-5 GM/200ML-% IV SOLN
1000.0000 mg | Freq: Once | INTRAVENOUS | Status: AC
  Administered 2014-12-02: 1000 mg via INTRAVENOUS
  Filled 2014-12-02: qty 200

## 2014-12-02 MED ORDER — PIPERACILLIN-TAZOBACTAM 3.375 G IVPB 30 MIN
3.3750 g | Freq: Once | INTRAVENOUS | Status: AC
  Administered 2014-12-02: 3.375 g via INTRAVENOUS
  Filled 2014-12-02: qty 50

## 2014-12-02 MED ORDER — ALUM & MAG HYDROXIDE-SIMETH 200-200-20 MG/5ML PO SUSP: 30.0000 mL | Freq: Four times a day (QID) | ORAL | Status: DC | PRN

## 2014-12-02 MED ORDER — PNEUMOCOCCAL VAC POLYVALENT 25 MCG/0.5ML IJ INJ
0.5000 mL | INJECTION | INTRAMUSCULAR | Status: AC
  Administered 2014-12-03: 0.5 mL via INTRAMUSCULAR
  Filled 2014-12-02: qty 0.5

## 2014-12-02 MED ORDER — ENOXAPARIN SODIUM 30 MG/0.3ML ~~LOC~~ SOLN
30.0000 mg | SUBCUTANEOUS | Status: DC
  Filled 2014-12-02: qty 0.3

## 2014-12-02 MED ORDER — ONDANSETRON HCL 4 MG PO TABS: 4.0000 mg | ORAL_TABLET | Freq: Four times a day (QID) | ORAL | Status: DC | PRN

## 2014-12-02 MED ORDER — ACETAMINOPHEN 650 MG RE SUPP: 650.0000 mg | Freq: Four times a day (QID) | RECTAL | Status: DC | PRN

## 2014-12-02 MED ORDER — PIPERACILLIN-TAZOBACTAM 3.375 G IVPB
3.3750 g | Freq: Three times a day (TID) | INTRAVENOUS | Status: DC
  Administered 2014-12-02 – 2014-12-03 (×2): 3.375 g via INTRAVENOUS
  Filled 2014-12-02 (×5): qty 50

## 2014-12-02 MED ORDER — ONDANSETRON HCL 4 MG/2ML IJ SOLN: 4.0000 mg | Freq: Four times a day (QID) | INTRAMUSCULAR | Status: DC | PRN

## 2014-12-02 MED ORDER — VANCOMYCIN HCL IN DEXTROSE 750-5 MG/150ML-% IV SOLN
750.0000 mg | INTRAVENOUS | Status: DC
  Administered 2014-12-03: 750 mg via INTRAVENOUS
  Filled 2014-12-02 (×2): qty 150

## 2014-12-02 MED ORDER — ACETAMINOPHEN 325 MG PO TABS
650.0000 mg | ORAL_TABLET | Freq: Four times a day (QID) | ORAL | Status: DC | PRN
  Administered 2014-12-03 – 2014-12-04 (×2): 650 mg via ORAL
  Filled 2014-12-02 (×2): qty 2

## 2014-12-02 NOTE — ED Notes (Signed)
EDP at bedside  

## 2014-12-02 NOTE — ED Notes (Signed)
Per EMS, pt comes in with c/o hypoglycemia and chest pain. Pt A&OX4, NAD noted. Initial CBG 20 at 1030, pt had eaten and given 1 amp of D50 given. CBG increased to 70 then decreased to 20 at 1140. 2nd Amp of D50 given and CBG was 54. Pt has open sore to right leg (BKA) and middle finger on right hand is gangrene. Daughter at bedside.

## 2014-12-02 NOTE — ED Notes (Signed)
Patient CBG was 192. 

## 2014-12-02 NOTE — ED Notes (Signed)
Phlebotomy at bedside.

## 2014-12-02 NOTE — ED Notes (Signed)
Patient transported to X-ray 

## 2014-12-02 NOTE — Plan of Care (Signed)
Re: VTE. Pt with hx bilateral LE amputations, GIB, CVA, and thrombocytopenia among others. Lovenox order questioned by pharmD. Pt hx GIB and was just recently taken off all anticoagulants. He also has thrombocytopenia now < 100000. At this point, would hold off all anticoags for VTE and report to oncoming attending in am for further assessment of risks vs benefits. Not due a dose tonight anyway. Given amputations, can not use SCDs.
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
## 2014-12-02 NOTE — ED Notes (Signed)
CBG was reported to Nurse.

## 2014-12-02 NOTE — Progress Notes (Signed)
ANTICOAGULATION CONSULT NOTE - Initial Consult  Pharmacy Consult for Lovenox VTE ppx- pharmacy may adjust dose Indication:VTE prophylaxis  No Known Allergies  Patient Measurements: Weight: 85 lb (38.556 kg)   Vital Signs: Temp: 97.6 F (36.4 C) (01/19 1759) Temp Source: Oral (01/19 1759) BP: 154/72 mmHg (01/19 1759) Pulse Rate: 107 (01/19 1759)  Labs:  Recent Labs  12/02/14 1430 12/02/14 1711  HGB 10.3* 8.6*  HCT 31.3* 26.1*  PLT 97* 87*  CREATININE 0.87 0.75    Estimated Creatinine Clearance: 31.6 mL/min (by C-G formula based on Cr of 0.75).   Medical History: Past Medical History  Diagnosis Date  . Diabetes mellitus   . Hypertension   . Myocardial infarction 2005  . Blood clot in vein   . Blindness temporary   . Hyperlipidemia   . CAD (coronary artery disease)   . Stroke 10/2008    resulting in left side weakness  . Protein calorie malnutrition 2011  . Atrial fibrillation 2010  . Gallstones 11/2010    seen on CT  . Compression fracture 11/2010    lumbar spine, seen on CT  . COPD (chronic obstructive pulmonary disease)   . Shortness of breath   . Legally blind      Assessment: Lovenox 40 mg SQ q24h ordered for VTE prophylaxis in this 80 y.o male with orders that pharmacy may adjust dose.  Patient's weight is 38.6 kg.   H/o chronic thrombocytopenia. Hgb 8.6, pltc 87K today.   Previously Hgb = 10.4 and  PLTC =101K on 11/20/14. H/o anemia associated with the B12 deficiency noted and h/o afib, CVA. Patient was taken off of all anticoagulants on 11/15 due to h/o GI bleed.    PMH: DM but taken off of all meds, PVD s/p b/l BKA, HTN, CVA, A-fib, B12 deficiency anemia, GI bleed taken off of all anticoagulants on 11/15, e coli bacteremia and sepsis in 11/15  Plan:  Decrease lovenox dose to 30mg  SQ q24h for VTE prophylaxis due to weight <45kg, CrCl ~6630ml/min.  I will contact MD to verify if lovenox needed in this patient with above history or consider SCDs.  Monitor  CBC and  for s/sx of bleeding   Noah Delaineuth Gazelle Towe, RPh Clinical Pharmacist Pager: [phone]  12/02/2014,7:31 PM

## 2014-12-02 NOTE — ED Notes (Signed)
Attempted to call report

## 2014-12-02 NOTE — Consult Note (Signed)
Doris Cheadle, MD Chief Complaint: R AKA stump infection History: CTSP for evaluation of right AKA stump site infection.  Patient with scar formation over distal stumpo site which fell off 2 days ago.  Family noted exposed bone but no drainage.   Patients sugar was low today and so they brought him via ambulance to hospital for treatment.    Past Medical History  Diagnosis Date  . Diabetes mellitus   . Hypertension   . Myocardial infarction 2005  . Blood clot in vein   . Blindness temporary   . Hyperlipidemia   . CAD (coronary artery disease)   . Stroke 10/2008    resulting in left side weakness  . Protein calorie malnutrition 2011  . Atrial fibrillation 2010  . Gallstones 11/2010    seen on CT  . Compression fracture 11/2010    lumbar spine, seen on CT  . COPD (chronic obstructive pulmonary disease)   . Shortness of breath     No Known Allergies  No current facility-administered medications on file prior to encounter.   Current Outpatient Prescriptions on File Prior to Encounter  Medication Sig Dispense Refill  . atorvastatin (LIPITOR) 80 MG tablet Take 80 mg by mouth daily.      . cephALEXin (KEFLEX) 500 MG capsule Take 1 capsule (500 mg total) by mouth 3 (three) times daily. 21 capsule 0  . cyanocobalamin (,VITAMIN B-12,) 1000 MCG/ML injection Inject 1 mL (1,000 mcg total) into the muscle every 30 (thirty) days. 12 mL 1  . metoprolol succinate (TOPROL-XL) 25 MG 24 hr tablet Take 25 mg by mouth daily.    . Needles & Syringes MISC 1 Syringe by Does not apply route every 30 (thirty) days. 12 each 1  . potassium chloride SA (K-DUR,KLOR-CON) 20 MEQ tablet Take 1 tablet (20 mEq total) by mouth daily. 30 tablet 0  . traMADol (ULTRAM) 50 MG tablet Take 1 tablet (50 mg total) by mouth every 6 (six) hours as needed (pain). 30 tablet 0  . ciprofloxacin (CIPRO) 500 MG tablet Take 1 tablet (500 mg total) by mouth 2 (two) times daily. (Patient not taking: Reported on 11/20/2014) 14 tablet 0     Physical Exam: Filed Vitals:   12/02/14 1616  BP: 125/74  Pulse:   Temp:   Resp: 18  poor historian.    Significant generalized cacexia No complaints of SOB/CP Abd soft/nt Bilateral LE AKA. L:  Stump appears healthy.  No signs of necrosis/break down. R: distal stump site has quarter size necrotic area.  Exposed bone visible.  No purulent material at stump wound site Hand - necrotic changes of multiple digits Palpable femoral pulses  Image: Dg Chest 2 View  12/02/2014   CLINICAL DATA:  Chest pain  EXAM: CHEST  2 VIEW  COMPARISON:  09/13/2014  FINDINGS: Cardiac shadow is within normal limits. The lungs are hyperinflated with some paucity of vascular markings consistent with COPD. Blunting of the right costophrenic angle is again seen. Multiple left rib fractures with some healing are noted. No focal infiltrate or effusion is seen. Diffuse aortic calcifications are noted as well as bilateral axillary and brachial artery calcifications. The bony structures show chronic compression deformity in the mid thoracic spine.  IMPRESSION: COPD without acute abnormality.  Healing left rib fractures are seen.   Electronically Signed   By: Alcide Clever M.D.   On: 12/02/2014 14:13   Dg Hand Complete Right  12/02/2014   CLINICAL DATA:  Gangrenous digit.  Diabetes.  EXAM: RIGHT HAND - COMPLETE 3+ VIEW  COMPARISON:  None.  FINDINGS: Severe peripheral vascular calcification. Soft tissue swelling is present over what appears to be the distal portion of the fourth digit. This be consistent with soft tissue infection. No underlying bony abnormality identified. No evidence of erosion or acute bony lesion. Degenerative changes noted throughout the wrist and hand.  IMPRESSION: 1. Soft-tissue swelling with air noted in what appears to be the distal portion of the right fourth digit. No underlying bony erosion.  2. Severe peripheral peripheral vascular calcification consistent with peripheral vascular disease.    Electronically Signed   By: Maisie Fushomas  Register   On: 12/02/2014 14:12   Dg Femur, Min 2 Views Right  12/02/2014   CLINICAL DATA:  Open wound at right amputation stump level  EXAM: DG FEMUR 2+V*R*  COMPARISON:  None.  FINDINGS: Frontal and lateral views were obtained. The patient has had above the knee amputation on the right. There is marked thinning of soft tissue at the level of the stump of. There is no overt bony destruction in this area. There is no acute fracture or dislocation. 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 There is widespread vascular calcification throughout the remaining right lower extremity.  IMPRESSION: Marked loss of soft tissue at the level of the amputation stump. No overt bony destruction. Advanced osteoarthritis and avascular necrosis change in the right femoral head. No acute fracture or dislocation. Widespread atherosclerotic arterial calcification. Note that this patient is at high risk for osteomyelitis development in the remaining distal femoral region given this appearance.   Electronically Signed   By: Bretta BangWilliam  Woodruff M.D.   On: 12/02/2014 14:11    A/P:  CTSP for possible infection of right BKA. Patient had AKA 2 yrs ago by Dr Charlann Boxerlin.  Has done well until recently. Per family there has been an increasing scar over the distal amputation site for the last several weeks.  The scar was removed last week and family noted the exposed bone.  Patient is a nonambulatory and has multiple medical issues Plan: 1. Will discuss case with Dr Lajoyce Cornersuda - question need for stump revision.    2. Defer management of hand issue to MD on hand call  3. Admit to medicine for abx/DM management  4. Further recommendations to follow.

## 2014-12-02 NOTE — Progress Notes (Signed)
ANTIBIOTIC CONSULT NOTE - INITIAL  Pharmacy Consult for vancomycin + zosyn Indication: wound infxn, r/o osteo  No Known Allergies  Patient Measurements: Weight: 85 lb (38.556 kg) Adjusted Body Weight:   Vital Signs: Temp: 97.3 F (36.3 C) (01/19 1456) Temp Source: Oral (01/19 1456) BP: 155/57 mmHg (01/19 1639) Pulse Rate: 61 (01/19 1639) Intake/Output from previous day:   Intake/Output from this shift:    Labs:  Recent Labs  12/02/14 1430  WBC 8.0  HGB 10.3*  PLT 97*  CREATININE 0.87   Estimated Creatinine Clearance: 29.1 mL/min (by C-G formula based on Cr of 0.87). No results for input(s): VANCOTROUGH, VANCOPEAK, VANCORANDOM, GENTTROUGH, GENTPEAK, GENTRANDOM, TOBRATROUGH, TOBRAPEAK, TOBRARND, AMIKACINPEAK, AMIKACINTROU, AMIKACIN in the last 72 hours.   Microbiology: No results found for this or any previous visit (from the past 720 hour(s)).  Medical History: Past Medical History  Diagnosis Date  . Diabetes mellitus   . Hypertension   . Myocardial infarction 2005  . Blood clot in vein   . Blindness temporary   . Hyperlipidemia   . CAD (coronary artery disease)   . Stroke 10/2008    resulting in left side weakness  . Protein calorie malnutrition 2011  . Atrial fibrillation 2010  . Gallstones 11/2010    seen on CT  . Compression fracture 11/2010    lumbar spine, seen on CT  . COPD (chronic obstructive pulmonary disease)   . Shortness of breath     Medications:  Anti-infectives    Start     Dose/Rate Route Frequency Ordered Stop   12/03/14 1800  vancomycin (VANCOCIN) IVPB 750 mg/150 ml premix     750 mg150 mL/hr over 60 Minutes Intravenous Every 24 hours 12/02/14 1642     12/03/14 0000  piperacillin-tazobactam (ZOSYN) IVPB 3.375 g     3.375 g12.5 mL/hr over 240 Minutes Intravenous Every 8 hours 12/02/14 1642     12/02/14 1645  piperacillin-tazobactam (ZOSYN) IVPB 3.375 g     3.375 g100 mL/hr over 30 Minutes Intravenous  Once 12/02/14 1640     12/02/14  1645  vancomycin (VANCOCIN) IVPB 1000 mg/200 mL premix     1,000 mg200 mL/hr over 60 Minutes Intravenous  Once 12/02/14 1640       Assessment: 79 yom presented to the ED with hypoglycemia. Starting empiric vancomycin + zosyn for wound infection and r/o osteo s/p R BKA. Pt is afebrile and WBC is WNL. Scr is also WNL.   Vanc 1/19>> Zosyn 1/19>>  Goal of Therapy:  Vancomycin trough level 15-20 mcg/ml  Plan:  1. Vancomycin 1gm IV x 1 then 750mg  IV Q24H 2. Zosyn 3.375gm IV x 1 over 30 min then 3.375gm IV Q8H (4 hr inf0 3. F/u renal fxn, C&S[facility]  Tanis Hensarling, Drake LeachRachel Lynn 12/02/2014,4:42 PM

## 2014-12-02 NOTE — ED Notes (Signed)
Attempted blood draw, unsuccessful. 

## 2014-12-02 NOTE — Progress Notes (Signed)
Jeris Pentarthur Gluth [id number] Code Status: full   Admission Data: 12/02/2014 5:50 PM Attending Provider:  Butler Denmarkrizwan  ONG:[id number]:ADVANI, Ayesha RumpfEEPAK, MD Consults/ Treatment Team: Treatment Team:  Venita Lickahari Brooks, MD  Jeris Pentarthur Vorndran is a 80 y.o. male patient admitted from ED awake, alert - oriented  X 3 - no acute distress noted.  VSS - Blood pressure 155/57, pulse 61, temperature 97.3 F (36.3 C), temperature source Oral, resp. rate 17, weight 38.556 kg (85 lb), SpO2 100 %.    IV in place, occlusive dsg intact without redness.  Orientation to room, and floor completed with information packet given to patient/family.  Patient declined safety video at this time.  Admission INP armband ID verified with patient/family, and in place.   SR up x 2, fall assessment complete, with patient and family able to verbalize understanding of risk associated with falls, and verbalized understanding to call nsg before up out of bed.  Call light within reach, patient able to voice, and demonstrate understanding.  Skin, clean-dry- intact without evidence of bruising, or skin tears.   No evidence of skin break down noted on exam.     Will cont to eval and treat per MD orders.  Katurah Karapetian, Delos HaringSIERRA D, RN 12/02/2014 5:50 PM 522full

## 2014-12-03 ENCOUNTER — Other Ambulatory Visit: Payer: Self-pay

## 2014-12-03 DIAGNOSIS — I739 Peripheral vascular disease, unspecified: Secondary | ICD-10-CM

## 2014-12-03 DIAGNOSIS — M8660 Other chronic osteomyelitis, unspecified site: Secondary | ICD-10-CM | POA: Diagnosis not present

## 2014-12-03 DIAGNOSIS — M79644 Pain in right finger(s): Secondary | ICD-10-CM

## 2014-12-03 DIAGNOSIS — I10 Essential (primary) hypertension: Secondary | ICD-10-CM

## 2014-12-03 DIAGNOSIS — R230 Cyanosis: Secondary | ICD-10-CM

## 2014-12-03 DIAGNOSIS — I96 Gangrene, not elsewhere classified: Secondary | ICD-10-CM

## 2014-12-03 DIAGNOSIS — D696 Thrombocytopenia, unspecified: Secondary | ICD-10-CM

## 2014-12-03 DIAGNOSIS — E43 Unspecified severe protein-calorie malnutrition: Secondary | ICD-10-CM

## 2014-12-03 LAB — CBC
HCT: 24.6 % — ABNORMAL LOW (ref 39.0–52.0)
HEMOGLOBIN: 8.2 g/dL — AB (ref 13.0–17.0)
MCH: 29.1 pg (ref 26.0–34.0)
MCHC: 33.3 g/dL (ref 30.0–36.0)
MCV: 87.2 fL (ref 78.0–100.0)
Platelets: 92 10*3/uL — ABNORMAL LOW (ref 150–400)
RBC: 2.82 MIL/uL — AB (ref 4.22–5.81)
RDW: 16.4 % — ABNORMAL HIGH (ref 11.5–15.5)
WBC: 6.6 10*3/uL (ref 4.0–10.5)

## 2014-12-03 LAB — GLUCOSE, CAPILLARY
GLUCOSE-CAPILLARY: 155 mg/dL — AB (ref 70–99)
Glucose-Capillary: 109 mg/dL — ABNORMAL HIGH (ref 70–99)
Glucose-Capillary: 134 mg/dL — ABNORMAL HIGH (ref 70–99)
Glucose-Capillary: 160 mg/dL — ABNORMAL HIGH (ref 70–99)
Glucose-Capillary: 176 mg/dL — ABNORMAL HIGH (ref 70–99)

## 2014-12-03 LAB — BASIC METABOLIC PANEL
Anion gap: 12 (ref 5–15)
BUN: 29 mg/dL — ABNORMAL HIGH (ref 6–23)
CHLORIDE: 102 meq/L (ref 96–112)
CO2: 27 mmol/L (ref 19–32)
CREATININE: 0.84 mg/dL (ref 0.50–1.35)
Calcium: 8.4 mg/dL (ref 8.4–10.5)
GFR calc Af Amer: >90 mL/min (ref >90)
GFR calc non Af Amer: 82 mL/min — ABNORMAL LOW (ref >90)
Glucose, Bld: 148 mg/dL — ABNORMAL HIGH (ref 70–99)
POTASSIUM: 2.9 mmol/L — AB (ref 3.5–5.1)
Sodium: 141 mmol/L (ref 135–145)

## 2014-12-03 MED ORDER — PIPERACILLIN-TAZOBACTAM 3.375 G IVPB
3.3750 g | Freq: Three times a day (TID) | INTRAVENOUS | Status: DC
  Administered 2014-12-03 – 2014-12-04 (×2): 3.375 g via INTRAVENOUS
  Filled 2014-12-03 (×5): qty 50

## 2014-12-03 MED ORDER — POTASSIUM CHLORIDE CRYS ER 20 MEQ PO TBCR
40.0000 meq | EXTENDED_RELEASE_TABLET | Freq: Once | ORAL | Status: AC
  Administered 2014-12-03: 40 meq via ORAL
  Filled 2014-12-03: qty 2

## 2014-12-03 MED ORDER — POTASSIUM CHLORIDE 10 MEQ/100ML IV SOLN
10.0000 meq | INTRAVENOUS | Status: AC
  Administered 2014-12-03 (×2): 10 meq via INTRAVENOUS
  Filled 2014-12-03 (×2): qty 100

## 2014-12-03 MED ORDER — ENSURE COMPLETE PO LIQD
237.0000 mL | Freq: Two times a day (BID) | ORAL | Status: DC
  Administered 2014-12-04 (×2): 237 mL via ORAL

## 2014-12-03 MED ORDER — ASPIRIN 81 MG PO CHEW
81.0000 mg | CHEWABLE_TABLET | Freq: Every day | ORAL | Status: DC
  Administered 2014-12-03 – 2014-12-04 (×2): 81 mg via ORAL
  Filled 2014-12-03 (×2): qty 1
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
## 2014-12-03 NOTE — Progress Notes (Signed)
Chaplain responded to consult.  Pt in uncomfortable position in bed, requested RN to help reposition.  Pt requested chaplain return when more comfortable.  Chaplain advocated for pt and will return at later time.    12/03/14 1600  Clinical Encounter Type  Visited With Patient  Visit Type Initial  Referral From Nurse  Spiritual Encounters  Spiritual Needs Emotional  Stress Factors  Patient Stress Factors Exhausted;Loss of control   Blain PaisOvercash, Karna Abed A, Chaplain 12/03/2014 4:05 PM
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
## 2014-12-03 NOTE — Progress Notes (Signed)
Notified Craige CottaKirby, NP that patient's urinalysis results show "many bacteria."
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
## 2014-12-03 NOTE — Progress Notes (Signed)
12/03/2014 1200 UR completed. Chart reviewed. Isidoro DonningAlesia Trayquan Kolakowski RN CCM Case Mgmt phone [phone]

## 2014-12-03 NOTE — Progress Notes (Addendum)
PROGRESS NOTE  [name] EAV:[id number] DOB: 08/11/1935 DOA: 12/02/2014 PCP: Doris CheadleADVANI, DEEPAK, MD  Assessment/Plan: Hypothermia -Continue empiric antibiotics pending culture data -Check TSH -A.m. Cortisol Hypoglycemia -I believe that these are false readings from the patient's Accu-Cheks as the patient has severe peripheral vascular disease which is likely making his CBGs falsely low -The patient's serum glucose checks have not been hypoglycemic -Hemoglobin A1c 5.9 -Given the patient's age, comorbidities, and current recommendations, we'll discontinue CBG checks altogether -Liberalize diet Peripheral vascular disease -Aspirin and Plavix were discontinued in November 2015 due to GI bleed  -I have discussed the risks, benefits, and alternatives given the patient's severe peripheral vascular disease and dry gangrene  -The family is agreeable to restart monotherapy with ASA and monitor closely History of duodenal and jejunal AVMs -Restart aspirin 81 mg daily and monitor closely Dry gangrene -Right finger and right stump--do not appear to be infected clinically -Consult wound care -After discussion with the patient's family,they do not want any further surgical intervention at this time but open to vascular consult--spoke with Dr. 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 The lungs are hyperinflated with some paucity of vascular markings consistent with COPD. Blunting of the right costophrenic angle is again seen. Multiple left rib fractures with some healing are noted. No focal infiltrate or effusion is seen. Diffuse aortic calcifications are noted as well as bilateral axillary and brachial artery calcifications. The bony structures show chronic compression deformity in the mid thoracic spine.  IMPRESSION: COPD without acute abnormality.  Healing left rib fractures are seen.   Electronically Signed   By: Alcide CleverMark  Lukens M.D.   On: 12/02/2014 14:13   Dg Hand Complete Right  12/02/2014   CLINICAL DATA:  Gangrenous digit.  Diabetes.  EXAM: RIGHT HAND - COMPLETE 3+ VIEW  COMPARISON:  None.  FINDINGS: Severe peripheral vascular calcification. Soft tissue swelling is present over what appears to be the distal portion of the fourth digit. This be consistent with soft tissue infection. No underlying bony abnormality identified. No evidence of erosion or acute bony lesion. Degenerative changes noted throughout the wrist and hand.  IMPRESSION: 1. Soft-tissue swelling with air noted in what appears to be the distal portion of the right fourth digit. No underlying bony erosion.  2. Severe peripheral peripheral vascular calcification consistent with peripheral vascular disease.   Electronically Signed   By: Maisie Fushomas  Register   On: 12/02/2014 14:12   Dg Femur, Min 2 Views Right  12/02/2014   CLINICAL DATA:  Open wound at right amputation stump level  EXAM: DG FEMUR 2+V*R*  COMPARISON:  None.  FINDINGS: Frontal and lateral views were obtained. The patient has had above the knee amputation on the right. There is marked thinning of soft tissue at the level of the stump of. There is no overt  bony destruction in this area. There is no acute fracture or dislocation. There is chronic avascular necrosis in the right  femoral head with advanced remodeling and joint space narrowing. There is widespread vascular calcification throughout the remaining right lower extremity.  IMPRESSION: Marked loss of soft tissue at the level of the amputation stump. No overt bony destruction. Advanced osteoarthritis and avascular necrosis change in the right femoral head. No acute fracture or dislocation. Widespread atherosclerotic arterial calcification. Note that this patient is at high risk for osteomyelitis development in the remaining distal femoral region given this appearance.   Electronically Signed   By: Bretta Bang M.D.   On: 12/02/2014 14:11         Subjective: Patient denies fevers, chills, headache, chest pain, dyspnea, nausea, vomiting, diarrhea, abdominal pain, dysuria, hematuria   Objective: Filed Vitals:   12/02/14 2058 12/02/14 2059 12/02/14 2108 12/03/14 0548  BP: 115/58 115/58  134/34  Pulse:    57  Temp:    98.2 F (36.8 C)  TempSrc:    Oral  Resp: 18   18  Height:   4' (1.219 m)   Weight:      SpO2: 100%   99%    Intake/Output Summary (Last 24 hours) at 12/03/14 1256 Last data filed at 12/03/14 1029  Gross per 24 hour  Intake    960 ml  Output    100 ml  Net    860 ml   Weight change:  Exam:   General:  Pt is alert, follows commands appropriately, not in acute distress  HEENT: No icterus, No thrush, No neck mass, [place]/AT  Cardiovascular: RRR, S1/S2, no rubs, no gallops  Respiratory: CTA bilaterally, no wheezing, no crackles, no rhonchi  Abdomen: Soft/+BS, non tender, non distended, no guarding  Extremities: No edema, No lymphangitis, No petechiae, No rashes, no synovitis; right stump--dry gangrene without any erythema or drainage  Data Reviewed: Basic Metabolic Panel:  Recent Labs Lab 12/02/14 1430 12/02/14 1711 12/03/14 0531  NA 141  --  141  K 3.5  --  2.9*  CL 102  --  102  CO2 24  --  27  GLUCOSE 267*  --  148*  BUN 30*  --  29*  CREATININE 0.87 0.75 0.84    CALCIUM 9.0  --  8.4   Liver Function Tests:  Recent Labs Lab 12/02/14 1430  AST 108*  ALT 93*  ALKPHOS 122*  BILITOT 1.3*  PROT 6.0  ALBUMIN 3.1*   No results for input(s): LIPASE, AMYLASE in the last 168 hours. No results for input(s): AMMONIA in the last 168 hours. CBC:  Recent Labs Lab 12/02/14 1430 12/02/14 1711 12/03/14 0531  WBC 8.0 6.2 6.6  NEUTROABS 7.4  --   --   HGB 10.3* 8.6* 8.2*  HCT 31.3* 26.1* 24.6*  MCV 89.4 88.8 87.2  PLT 97* 87* 92*   Cardiac Enzymes: No results for input(s): CKTOTAL, CKMB, CKMBINDEX, TROPONINI in the last 168 hours. BNP: Invalid input(s): POCBNP CBG:  Recent Labs Lab 12/02/14 2051 12/03/14 0029 12/03/14 0402 12/03/14 0809 12/03/14 1204  GLUCAP 164* 176* 155* 109* 160*    No results found for this or any previous visit (from the past 240 hour(s)).   Scheduled Meds: . piperacillin-tazobactam (ZOSYN)  IV  3.375 g Intravenous Q8H  . vancomycin  750 mg Intravenous Q24H   Continuous Infusions: . sodium chloride 75 mL/hr at 12/02/14 1656     Braeden Dolinski, DO  [facility]  Pager [phone]  If 7PM-7AM, please contact night-coverage [email or web address] Password [email or web address] 12/03/2014, 12:56 PM   LOS: 1 day

## 2014-12-04 LAB — BASIC METABOLIC PANEL
Anion gap: 7 (ref 5–15)
BUN: 30 mg/dL — ABNORMAL HIGH (ref 6–23)
CALCIUM: 8.5 mg/dL (ref 8.4–10.5)
CO2: 31 mmol/L (ref 19–32)
CREATININE: 0.86 mg/dL (ref 0.50–1.35)
Chloride: 102 mEq/L (ref 96–112)
GFR calc Af Amer: >90 mL/min (ref >90)
GFR, EST NON AFRICAN AMERICAN: 82 mL/min — AB (ref >90)
GLUCOSE: 103 mg/dL — AB (ref 70–99)
Potassium: 4.2 mmol/L (ref 3.5–5.1)
Sodium: 140 mmol/L (ref 135–145)

## 2014-12-04 LAB — CBC
HCT: 23 % — ABNORMAL LOW (ref 39.0–52.0)
Hemoglobin: 7.6 g/dL — ABNORMAL LOW (ref 13.0–17.0)
MCH: 29.5 pg (ref 26.0–34.0)
MCHC: 33 g/dL (ref 30.0–36.0)
MCV: 89.1 fL (ref 78.0–100.0)
PLATELETS: 85 10*3/uL — AB (ref 150–400)
RBC: 2.58 MIL/uL — AB (ref 4.22–5.81)
RDW: 16.6 % — AB (ref 11.5–15.5)
WBC: 6.1 10*3/uL (ref 4.0–10.5)

## 2014-12-04 LAB — TSH: TSH: 1.702 u[IU]/mL (ref 0.350–4.500)

## 2014-12-04 MED ORDER — MUPIROCIN CALCIUM 2 % EX CREA
TOPICAL_CREAM | Freq: Every day | CUTANEOUS | Status: DC
  Administered 2014-12-04: 14:00:00 via TOPICAL
  Filled 2014-12-04: qty 15

## 2014-12-04 MED ORDER — ASPIRIN 81 MG PO CHEW: 81.0000 mg | CHEWABLE_TABLET | Freq: Every day | ORAL | Status: AC

## 2014-12-04 MED ORDER — TRAMADOL HCL 50 MG PO TABS: 50.0000 mg | ORAL_TABLET | Freq: Four times a day (QID) | ORAL | Status: AC | PRN

## 2014-12-04 MED ORDER — ENSURE COMPLETE PO LIQD: 237.0000 mL | Freq: Two times a day (BID) | ORAL | Status: AC

## 2014-12-04 NOTE — Progress Notes (Signed)
    Subjective:     Patient reports pain as 2 on 0-10 scale.   Denies CP or SOB.  Voiding without difficulty. Positive flatus. Objective: Vital signs in last 24 hours: Temp:  [98 F (36.7 C)-100.3 F (37.9 C)] 100.3 F (37.9 C) (01/21 16100613) Pulse Rate:  [66] 66 (01/20 1500) Resp:  [16-20] 18 (01/21 0613) BP: (106-145)/(54-79) 106/79 mmHg (01/21 0613) SpO2:  [98 %-100 %] 98 % (01/21 0613)  Intake/Output from previous day: 01/20 0701 - 01/21 0700 In: 1397 [P.O.:1197; IV Piggyback:200] Out: 325 [Urine:325] Intake/Output this shift: Total I/O In: 480 [P.O.:480] Out: -   Labs:  Recent Labs  12/02/14 1430 12/02/14 1711 12/03/14 0531 12/04/14 0721  HGB 10.3* 8.6* 8.2* 7.6*    Recent Labs  12/03/14 0531 12/04/14 0721  WBC 6.6 6.1  RBC 2.82* 2.58*  HCT 24.6* 23.0*  PLT 92* 85*    Recent Labs  12/03/14 0531 12/04/14 0721  NA 141 140  K 2.9* 4.2  CL 102 102  CO2 27 31  BUN 29* 30*  CREATININE 0.84 0.86  GLUCOSE 148* 103*  CALCIUM 8.4 8.5   No results for input(s): LABPT, INR in the last 72 hours.  Physical Exam: ABD soft Wound unchanged No drainage  Assessment/Plan:     Agree with Dr Lajoyce Cornersuda No indication for surgical intervention [facility] Will sign off Please call if questions arise  [place] Commisso D for Dr. Venita Lickahari Ermalee Mealy [facility] [phone] 12/04/2014, 9:57 AM

## 2014-12-04 NOTE — Care Management Note (Signed)
    Page 1 of 1   12/04/2014     11:16:25 AM CARE MANAGEMENT NOTE 12/04/2014  Patient:  [name],[name]   Account Number:  [id number]  Date Initiated:  12/03/2014  Documentation initiated by:  [facility]ALESIA  Subjective/Objective Assessment:   ulcer on her right stump     Action/Plan:   lives at home with dtr, [name] [phone]   Anticipated DC Date:  12/04/2014   Anticipated DC Plan:  HOME W HOME HEALTH SERVICES      DC Planning Services  CM consult      [facility]   Choice offered to / List presented to:  C-1 Patient        HH arranged  HH-1 RN      [facility]  [facility]   Status of service:  Completed, signed off Medicare Important Message given?  NA - LOS <3 / Initial given by admissions (If response is "NO", the following Medicare IM given date fields will be blank) Date Medicare IM given:   Medicare IM given by:   Date Additional Medicare IM given:   Additional Medicare IM given by:    Discharge Disposition:  HOME W HOME HEALTH SERVICES  Per UR Regulation:  Reviewed for med. necessity/level of care/duration of stay  If discussed at Long Length of Stay Meetings, dates discussed:    Comments:  12/04/14 1114 Letha Capeeborah Luisdavid Hamblin RN, BSN [phone] patient is for dc today and [facility], [facility], [facility]. Soc will begin 24-48 hrs post dc.   12/03/2014 1200 UR completed. Chart reviewed. Isidoro DonningAlesia Shavis RN CCM Case Mgmt phone [phone]

## 2014-12-04 NOTE — Clinical Social Work Note (Signed)
CSW received call from RN stating that [name] has not arrived and [facility] states they did not receive transport request. CSW witnessed [facility] made [facility]. BSW has resubmitted request for patient's transport. BSW contact [name] to explain reason for delay. [name] is understanding. [name] requests to speak with case manager regarding HH. BSW intern has made RNCM by voicemail.   [name] [name] MSW, [place]CSWA, [place], [phone]

## 2014-12-04 NOTE — Plan of Care (Signed)
Problem: Phase III Progression Outcomes Goal: Voiding independently Outcome: Not Met (add Reason) Pt is incontinent.

## 2014-12-04 NOTE — Consult Note (Addendum)
WOC wound consult note Reason for Consult: consult requested for right finger and right leg stump.  Consults and recommendations have already been performed by Vascular and Ortho services; refer to previous progress notes. Both teams indicate that topical treatment will not be effective for these wounds; pt and family declines amputation at this time according to the EMR.   Wound type: Right tip of 4th finger with 100% dry eschar; affected area 1.5X1cm.  No odor or drainage.  Right leg stump 2X2cm 50% eschar, 30% exposed bone, 20% yellow.  Small amt yellow drainage, no odor Dressing procedure/placement/frequency: It is best practice to leave dry stable eschar intact to right finger; it can remain open to air.  Right stump is high risk to have osteomyelitis occur R/T exposed bone.  Bactroban to provide antimicrobial benefits and foam dressing to protect from further injury.  Both are conservative measures; if aggressive plan of care is desired at some point, please re consult ortho service. Please re-consult if further assistance is needed.  Thank-you,  Kevin Mcgeeawn [name] MSN, RN, CWOCN, [place]CN-AP, CNS [phone]

## 2014-12-04 NOTE — Clinical Social Work Note (Signed)
BSW intern informed patient's daughter, Samuella Cotaandora, that patient will be discharged home by ambulance.

## 2014-12-04 NOTE — Discharge Summary (Signed)
Physician Discharge Summary  [name] ZOX:[id number] DOB: 11/15/1934 DOA: 12/02/2014  PCP: Doris Cheadle, MD  Admit date: 12/02/2014 Discharge date: 12/04/2014  Recommendations for Outpatient Follow-up:  1. Pt will need to follow up with PCP in 2 weeks post discharge 2. Please obtain BMP and CBC in one week Discharge Diagnoses:  Hypothermia -Continue empiric antibiotics pending culture data -Check TSH--1.702 -A.m. Cortisol--pending at time of d/c -The patient did not have any further episodes of hypothermia. The patient remained afebrile and clinically stable. Hypoglycemia -I believe that these are false readings from the patient's Accu-Cheks as the patient has severe peripheral vascular disease which is likely making his CBGs falsely low -The patient's serum glucose checks have not been hypoglycemic -Hemoglobin A1c 5.9 -Given the patient's age, comorbidities, and current recommendations, we'll discontinue CBG checks altogether -Liberalize diet Peripheral vascular disease -Aspirin and Plavix were discontinued in November 2015 due to GI bleed  -I have discussed the risks, benefits, and alternatives given the patient's severe peripheral vascular disease and dry gangrene  -The family is agreeable to restart monotherapy with ASA and monitor closely -Vascular surgery consult was obtained. The patient was seen by Dr. Darrick Penna. He did not recommend any revascularization procedures at this time. History of duodenal and jejunal AVMs -Restart aspirin 81 mg daily and monitor closely -The patient's hemoglobin remained stable. There was a mild drop due to dilution from his IV fluids. The patient's family was instructed to watch for any signs of bleeding including but not limited to hematochezia, melena, hemoptysis, hematuria Dry gangrene -Right finger and right stump--do not appear to be infected clinically -Consult wound care -After discussion with the patient's family,they do not want any  further surgical intervention at this time but open to vascular consult--spoke with Dr. 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 Severe protein calorie malnutrition -Continue nutritional supplements Paroxysmal atrial fibrillation -Presently in sinus rhythm -Restart ASA as discussed above Thrombocytopenia -Has been chronic -Monitor for signs of bleeding History of CVA -Restart ASA as discussed   Discharge Condition: stable  Disposition: home  Diet:regular Wt Readings from Last 3 Encounters:  12/02/14 38.556 kg (85 lb)  11/20/14 36.378 kg (80 lb 3.2 oz)  09/16/14 46.1 kg (101 lb 10.1 oz)    History of present illness:  80 y.o. male with DM but taken off of all meds, PVD s/p b/l BKA, HTN, CVA, A-fib, B12 deficiency anemia, GI bleed taken off of all anticoagulants on 11/15, e coli bacteremia and sepsis in 11/15, who comes in with hypoglycemia which has resolved. The patient was also complaining of pain in his right fourth digit as well as his right leg and rotation side. There was concern that he developed an ulcer on his right stump that appeared to be worse according to the patient's family. He was also found to be hypothermic. He is not been on any medication for diabetes lately due to his low blood sugars. The patient was admitted with concerns for an infectious process given his hypothermia and hypoglycemia. The patient's hypothermia resolved without any further intervention. His hypoglycemia was likely due to his peripheral vascular disease with CBG checks being falsely low. He had numerous serum glucoses that did not show any hypoglycemia. The patient was started on intravenous antibiotics after cultures were obtained. Blood cultures were  negative. Urinalysis did not show any pyuria. Chest x-ray did not show any infiltrates. The patient was seen by vascular surgery who did not recommend any intervention at this time. He was also seen by orthopedics who did not feel that the patient could tolerate further amputation at this time given his debilitated medical condition. It was recommended that the patient be discharged with symptomatically therapy for his pain as well as wound care. The patient's hemoglobin was monitored. After discussion with the patient's family regarding the risks, benefits, alternatives, it was agreed upon to restart the patient on aspirin 81 mg daily for the patient's peripheral vascular disease and history of stroke. The patient's family was instructed to watch for any signs of bleeding and to follow-up with his primary care provider.   Consultants: [facility]-Dr. Bernarda Caffey  Discharge Exam: Filed Vitals:   12/04/14 0613  BP: 106/79  Pulse:   Temp: 100.3 F (37.9 C)  Resp: 18   Filed Vitals:   12/03/14 0548 12/03/14 1500 12/03/14 2203 12/04/14 0613  BP: 134/34 110/57 145/54 106/79  Pulse: 57 66    Temp: 98.2 F (36.8 C) 98 F (36.7 C) 98.1 F (36.7 C) 100.3 F (37.9 C)  TempSrc: Oral Oral Oral Oral  Resp: 18 16 20 18   Height:      Weight:      SpO2: 99% 100% 98% 98%   General: A&O x 3, NAD, pleasant, cooperative Cardiovascular: RRR, no rub, no gallop, no S3 Respiratory: CTAB, no wheeze, no rhonchi Abdomen:soft, nontender, nondistended, positive bowel sounds Extremities: No edema, No lymphangitis, no petechiae  Discharge Instructions      Discharge Instructions    Diet - low sodium heart healthy    Complete by:  As directed      Increase activity slowly    Complete by:  As directed             Medication List    STOP taking these medications        cephALEXin 500 MG capsule  Commonly known as:  KEFLEX     ciprofloxacin 500 MG tablet  Commonly known as:  CIPRO      potassium chloride SA 20 MEQ tablet  Commonly known as:  K-DUR,KLOR-CON      TAKE these medications        aspirin 81 MG chewable tablet  Chew 1 tablet (81 mg total) by mouth daily.     atorvastatin 80 MG tablet  Commonly known as:  LIPITOR  Take 80 mg by mouth daily.     cyanocobalamin 1000 MCG/ML injection  Commonly known as:  (VITAMIN B-12)  Inject 1 mL (1,000 mcg total) into the muscle every 30 (thirty) days.     feeding supplement (ENSURE COMPLETE) Liqd  Take 237 mLs by mouth 2 (two) times daily between meals.     metoprolol succinate 25 MG 24 hr tablet  Commonly known as:  TOPROL-XL  Take 25 mg by mouth daily.     Needles & Syringes Misc  1 Syringe by Does not apply route every 30 (thirty) days.     traMADol 50 MG tablet  Commonly known as:  ULTRAM  Take 1 tablet (50 mg total) by mouth every 6 (six) hours as needed (pain).         The results of significant diagnostics from this hospitalization (including imaging, microbiology, ancillary and laboratory) are listed below for reference.    Significant Diagnostic Studies: Dg Chest 2 View  12/02/2014  CLINICAL DATA:  Chest pain  EXAM: CHEST  2 VIEW  COMPARISON:  09/13/2014  FINDINGS: Cardiac shadow is within normal limits. The lungs are hyperinflated with some paucity of vascular markings consistent with COPD. Blunting of the right costophrenic angle is again seen. Multiple left rib fractures with some healing are noted. No focal infiltrate or effusion is seen. Diffuse aortic calcifications are noted as well as bilateral axillary and brachial artery calcifications. The bony structures show chronic compression deformity in the mid thoracic spine.  IMPRESSION: COPD without acute abnormality.  Healing left rib fractures are seen.   Electronically Signed   By: Alcide CleverMark  Lukens M.D.   On: 12/02/2014 14:13   Dg Hand Complete Right  12/02/2014   CLINICAL DATA:  Gangrenous digit.  Diabetes.  EXAM: RIGHT HAND - COMPLETE 3+ VIEW   COMPARISON:  None.  FINDINGS: Severe peripheral vascular calcification. Soft tissue swelling is present over what appears to be the distal portion of the fourth digit. This be consistent with soft tissue infection. No underlying bony abnormality identified. No evidence of erosion or acute bony lesion. Degenerative changes noted throughout the wrist and hand.  IMPRESSION: 1. Soft-tissue swelling with air noted in what appears to be the distal portion of the right fourth digit. No underlying bony erosion.  2. Severe peripheral peripheral vascular calcification consistent with peripheral vascular disease.   Electronically Signed   By: Maisie Fushomas  Register   On: 12/02/2014 14:12   Dg Femur, Min 2 Views Right  12/02/2014   CLINICAL DATA:  Open wound at right amputation stump level  EXAM: DG FEMUR 2+V*R*  COMPARISON:  None.  FINDINGS: Frontal and lateral views were obtained. The patient has had above the knee amputation on the right. There is marked thinning of soft tissue at the level of the stump of. There is no overt bony destruction in this area. There is no acute fracture or dislocation. 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 There is widespread vascular calcification throughout the remaining right lower extremity.  IMPRESSION: Marked loss of soft tissue at the level of the amputation stump. No overt bony destruction. Advanced osteoarthritis and avascular necrosis change in the right femoral head. No acute fracture or dislocation. Widespread atherosclerotic arterial calcification. Note that this patient is at high risk for osteomyelitis development in the remaining distal femoral region given this appearance.   Electronically Signed   By: Bretta BangWilliam  Woodruff M.D.   On: 12/02/2014 14:11     Microbiology: Recent Results (from the past 240 hour(s))  Blood culture (routine x 2)     Status: None (Preliminary result)   Collection Time: 12/02/14   5:11 PM  Result Value Ref Range Status   Specimen Description BLOOD RIGHT FOREARM  Final   Special Requests BOTTLES DRAWN AEROBIC AND ANAEROBIC 5CC  Final   Culture   Final           BLOOD CULTURE RECEIVED NO GROWTH TO DATE CULTURE WILL BE HELD FOR 5 DAYS BEFORE ISSUING A FINAL NEGATIVE REPORT [facility]    Report Status PENDING  Incomplete  Blood culture (routine x 2)     Status: None (Preliminary result)   Collection Time: 12/02/14  6:22 PM  Result Value Ref Range Status   Specimen Description BLOOD LEFT HAND  Final   Special Requests BOTTLES DRAWN AEROBIC ONLY 5CC  Final   Culture   Final  BLOOD CULTURE RECEIVED NO GROWTH TO DATE CULTURE WILL BE HELD FOR 5 DAYS BEFORE ISSUING A FINAL NEGATIVE REPORT Performed at [facility]    Report Status PENDING  Incomplete     Labs: Basic Metabolic Panel:  Recent Labs Lab 12/02/14 1430 12/02/14 1711 12/03/14 0531 12/04/14 0721  NA 141  --  141 140  K 3.5  --  2.9* 4.2  CL 102  --  102 102  CO2 24  --  27 31  GLUCOSE 267*  --  148* 103*  BUN 30*  --  29* 30*  CREATININE 0.87 0.75 0.84 0.86  CALCIUM 9.0  --  8.4 8.5   Liver Function Tests:  Recent Labs Lab 12/02/14 1430  AST 108*  ALT 93*  ALKPHOS 122*  BILITOT 1.3*  PROT 6.0  ALBUMIN 3.1*   No results for input(s): LIPASE, AMYLASE in the last 168 hours. No results for input(s): AMMONIA in the last 168 hours. CBC:  Recent Labs Lab 12/02/14 1430 12/02/14 1711 12/03/14 0531 12/04/14 0721  WBC 8.0 6.2 6.6 6.1  NEUTROABS 7.4  --   --   --   HGB 10.3* 8.6* 8.2* 7.6*  HCT 31.3* 26.1* 24.6* 23.0*  MCV 89.4 88.8 87.2 89.1  PLT 97* 87* 92* 85*   Cardiac Enzymes: No results for input(s): CKTOTAL, CKMB, CKMBINDEX, TROPONINI in the last 168 hours. BNP: Invalid input(s): POCBNP CBG:  Recent Labs Lab 12/03/14 0029 12/03/14 0402 12/03/14 0809 12/03/14 1204 12/03/14 2206  GLUCAP 176* 155* 109* 160* 134*    Time  coordinating discharge:  Greater than 30 minutes  Signed:  Deshae Dickison, DO [facility] Pager: [phone] 12/04/2014, 11:17 AM

## 2014-12-04 NOTE — Progress Notes (Signed)
[name] to be D/C'd Home per MD order.  Discussed with the patient and all questions fully answered.    Medication List    STOP taking these medications        cephALEXin 500 MG capsule  Commonly known as:  KEFLEX     ciprofloxacin 500 MG tablet  Commonly known as:  CIPRO     potassium chloride SA 20 MEQ tablet  Commonly known as:  K-DUR,KLOR-CON      TAKE these medications        aspirin 81 MG chewable tablet  Chew 1 tablet (81 mg total) by mouth daily.     atorvastatin 80 MG tablet  Commonly known as:  LIPITOR  Take 80 mg by mouth daily.     cyanocobalamin 1000 MCG/ML injection  Commonly known as:  (VITAMIN B-12)  Inject 1 mL (1,000 mcg total) into the muscle every 30 (thirty) days.     feeding supplement (ENSURE COMPLETE) Liqd  Take 237 mLs by mouth 2 (two) times daily between meals.     metoprolol succinate 25 MG 24 hr tablet  Commonly known as:  TOPROL-XL  Take 25 mg by mouth daily.     Needles & Syringes Misc  1 Syringe by Does not apply route every 30 (thirty) days.     traMADol 50 MG tablet  Commonly known as:  ULTRAM  Take 1 tablet (50 mg total) by mouth every 6 (six) hours as needed (pain).        VVS, Skin clean, dry and intact without evidence of skin break down, no evidence of skin tears noted. IV catheter discontinued intact. Site without signs and symptoms of complications. Dressing and pressure applied.  Yellow packet given to EMS.  D/c education completed with patient's daughter [name] via telephone  including follow up instructions, medication list, d/c activities limitations if indicated, with other d/c instructions as indicated by MD -family able to verbalize understanding, all questions fully answered.   Family instructed to return to ED, call 911, or call MD for any changes in condition.   Patient escorted via stretcher, and D/C home via EMS.  [name], [name] 12/04/2014 4:59 PM

## 2014-12-05 LAB — CORTISOL-AM, BLOOD: Cortisol - AM: 14.8 ug/dL (ref 4.3–22.4)

## 2014-12-09 LAB — CULTURE, BLOOD (ROUTINE X 2)
Culture: NO GROWTH
Culture: NO GROWTH

## 2014-12-12 ENCOUNTER — Encounter: Payer: Medicare Other | Admitting: Vascular Surgery

## 2014-12-12 ENCOUNTER — Other Ambulatory Visit (HOSPITAL_COMMUNITY): Payer: Medicare Other

## 2014-12-18 ENCOUNTER — Encounter: Payer: Medicare Other | Admitting: Vascular Surgery

## 2014-12-18 ENCOUNTER — Other Ambulatory Visit (HOSPITAL_COMMUNITY): Payer: Medicare Other

## 2014-12-28 ENCOUNTER — Emergency Department (HOSPITAL_COMMUNITY)

## 2014-12-28 ENCOUNTER — Encounter (HOSPITAL_COMMUNITY): Payer: Self-pay | Admitting: Physical Medicine and Rehabilitation

## 2014-12-28 ENCOUNTER — Emergency Department (HOSPITAL_COMMUNITY)
Admission: EM | Admit: 2014-12-28 | Discharge: 2014-12-28 | Disposition: A | Discharge status: Home / Self Care | Attending: Emergency Medicine | Admitting: Emergency Medicine

## 2014-12-28 DIAGNOSIS — E119 Type 2 diabetes mellitus without complications: Secondary | ICD-10-CM | POA: Insufficient documentation

## 2014-12-28 DIAGNOSIS — H548 Legal blindness, as defined in USA: Secondary | ICD-10-CM | POA: Insufficient documentation

## 2014-12-28 DIAGNOSIS — E785 Hyperlipidemia, unspecified: Secondary | ICD-10-CM | POA: Diagnosis not present

## 2014-12-28 DIAGNOSIS — J449 Chronic obstructive pulmonary disease, unspecified: Secondary | ICD-10-CM | POA: Insufficient documentation

## 2014-12-28 DIAGNOSIS — R630 Anorexia: Secondary | ICD-10-CM | POA: Diagnosis not present

## 2014-12-28 DIAGNOSIS — I252 Old myocardial infarction: Secondary | ICD-10-CM | POA: Insufficient documentation

## 2014-12-28 DIAGNOSIS — Z86718 Personal history of other venous thrombosis and embolism: Secondary | ICD-10-CM | POA: Insufficient documentation

## 2014-12-28 DIAGNOSIS — I7389 Other specified peripheral vascular diseases: Secondary | ICD-10-CM | POA: Diagnosis not present

## 2014-12-28 DIAGNOSIS — Z87891 Personal history of nicotine dependence: Secondary | ICD-10-CM | POA: Insufficient documentation

## 2014-12-28 DIAGNOSIS — Z7982 Long term (current) use of aspirin: Secondary | ICD-10-CM | POA: Insufficient documentation

## 2014-12-28 DIAGNOSIS — Z8719 Personal history of other diseases of the digestive system: Secondary | ICD-10-CM | POA: Diagnosis not present

## 2014-12-28 DIAGNOSIS — I739 Peripheral vascular disease, unspecified: Secondary | ICD-10-CM

## 2014-12-28 DIAGNOSIS — I2510 Atherosclerotic heart disease of native coronary artery without angina pectoris: Secondary | ICD-10-CM | POA: Insufficient documentation

## 2014-12-28 DIAGNOSIS — I4891 Unspecified atrial fibrillation: Secondary | ICD-10-CM | POA: Diagnosis not present

## 2014-12-28 DIAGNOSIS — Z79899 Other long term (current) drug therapy: Secondary | ICD-10-CM | POA: Diagnosis not present

## 2014-12-28 DIAGNOSIS — Z8673 Personal history of transient ischemic attack (TIA), and cerebral infarction without residual deficits: Secondary | ICD-10-CM | POA: Insufficient documentation

## 2014-12-28 DIAGNOSIS — I10 Essential (primary) hypertension: Secondary | ICD-10-CM | POA: Insufficient documentation

## 2014-12-28 DIAGNOSIS — M79641 Pain in right hand: Secondary | ICD-10-CM | POA: Diagnosis present

## 2014-12-28 DIAGNOSIS — Z87311 Personal history of (healed) other pathological fracture: Secondary | ICD-10-CM | POA: Insufficient documentation

## 2014-12-28 LAB — COMPREHENSIVE METABOLIC PANEL
ALK PHOS: 85 U/L (ref 39–117)
ALT: 23 U/L (ref 0–53)
ANION GAP: 10 (ref 5–15)
AST: 35 U/L (ref 0–37)
Albumin: 2.9 g/dL — ABNORMAL LOW (ref 3.5–5.2)
BUN: 14 mg/dL (ref 6–23)
CO2: 27 mmol/L (ref 19–32)
Calcium: 9.6 mg/dL (ref 8.4–10.5)
Chloride: 97 mmol/L (ref 96–112)
Creatinine, Ser: 0.57 mg/dL (ref 0.50–1.35)
GFR calc non Af Amer: >90 mL/min (ref >90)
GLUCOSE: 83 mg/dL (ref 70–99)
Potassium: 4.8 mmol/L (ref 3.5–5.1)
SODIUM: 134 mmol/L — AB (ref 135–145)
Total Bilirubin: 0.8 mg/dL (ref 0.3–1.2)
Total Protein: 6.7 g/dL (ref 6.0–8.3)

## 2014-12-28 LAB — CBC WITH DIFFERENTIAL/PLATELET
BASOS PCT: 0 % (ref 0–1)
Basophils Absolute: 0 10*3/uL (ref 0.0–0.1)
EOS ABS: 0 10*3/uL (ref 0.0–0.7)
Eosinophils Relative: 0 % (ref 0–5)
HEMATOCRIT: 33.4 % — AB (ref 39.0–52.0)
Hemoglobin: 11 g/dL — ABNORMAL LOW (ref 13.0–17.0)
LYMPHS ABS: 0.5 10*3/uL — AB (ref 0.7–4.0)
LYMPHS PCT: 8 % — AB (ref 12–46)
MCH: 30 pg (ref 26.0–34.0)
MCHC: 32.9 g/dL (ref 30.0–36.0)
MCV: 91 fL (ref 78.0–100.0)
MONOS PCT: 7 % (ref 3–12)
Monocytes Absolute: 0.4 10*3/uL (ref 0.1–1.0)
Neutro Abs: 4.8 10*3/uL (ref 1.7–7.7)
Neutrophils Relative %: 84 % — ABNORMAL HIGH (ref 43–77)
Platelets: 196 10*3/uL (ref 150–400)
RBC: 3.67 MIL/uL — ABNORMAL LOW (ref 4.22–5.81)
RDW: 15.6 % — AB (ref 11.5–15.5)
WBC: 5.8 10*3/uL (ref 4.0–10.5)

## 2014-12-28 LAB — PROTIME-INR
INR: 1.09 (ref 0.00–1.49)
Prothrombin Time: 14.2 seconds (ref 11.6–15.2)

## 2014-12-28 LAB — URINALYSIS, ROUTINE W REFLEX MICROSCOPIC
BILIRUBIN URINE: NEGATIVE
GLUCOSE, UA: NEGATIVE mg/dL
Ketones, ur: NEGATIVE mg/dL
Leukocytes, UA: NEGATIVE
Nitrite: NEGATIVE
PH: 5.5 (ref 5.0–8.0)
PROTEIN: NEGATIVE mg/dL
Specific Gravity, Urine: 1.012 (ref 1.005–1.030)
Urobilinogen, UA: 0.2 mg/dL (ref 0.0–1.0)

## 2014-12-28 LAB — I-STAT CG4 LACTIC ACID, ED
LACTIC ACID, VENOUS: 1.34 mmol/L (ref 0.5–2.0)
Lactic Acid, Venous: 1.44 mmol/L (ref 0.5–2.0)

## 2014-12-28 LAB — URINE MICROSCOPIC-ADD ON

## 2014-12-28 MED ORDER — SODIUM CHLORIDE 0.9 % IV BOLUS (SEPSIS)
500.0000 mL | Freq: Once | INTRAVENOUS | Status: AC
  Administered 2014-12-28: 500 mL via INTRAVENOUS

## 2014-12-28 MED ORDER — CEPHALEXIN 250 MG PO CAPS
500.0000 mg | ORAL_CAPSULE | Freq: Once | ORAL | Status: AC
  Administered 2014-12-28: 500 mg via ORAL
  Filled 2014-12-28: qty 2

## 2014-12-28 MED ORDER — CEPHALEXIN 500 MG PO CAPS: 500.0000 mg | ORAL_CAPSULE | Freq: Four times a day (QID) | ORAL | Status: AC

## 2014-12-28 NOTE — ED Notes (Addendum)
Pt presents to department via [facility] for evaluation of R hand pain. Pt reports increased pain and swelling to R hand this morning. Upon arrival R hand noted to be swollen, necrotic tissue noted to fingers. Pt is alert and answering questions appropriately. Pt is under care of hospice at home. CBG 80.

## 2014-12-28 NOTE — ED Notes (Signed)
Notified [facility] for transportation back home 
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
## 2014-12-28 NOTE — Discharge Instructions (Signed)

## 2014-12-28 NOTE — ED Notes (Signed)
Able to wiggle digits to R hand. Unable to palpate R radial pulse, capillary refill greater than 2 seconds. R hand swollen, dry skin noted, necrotic black/yellow colored tissue noted to fingers.

## 2014-12-28 NOTE — ED Notes (Addendum)
Attempted I&O cath size 16 & 14 unable to get urine back, catheter is coiling. Dr. Lynelle DoctorKnapp made aware sts can do condom catheter.

## 2014-12-29 LAB — URINE CULTURE
CULTURE: NO GROWTH
Colony Count: NO GROWTH

## 2015-01-03 LAB — CULTURE, BLOOD (ROUTINE X 2)
CULTURE: NO GROWTH
Culture: NO GROWTH

## 2015-01-06 ENCOUNTER — Other Ambulatory Visit: Payer: Self-pay | Admitting: Internal Medicine

## 2015-02-13 DEATH — deceased

## 2015-05-20 ENCOUNTER — Other Ambulatory Visit: Payer: Self-pay | Admitting: *Deleted

## 2015-05-20 DIAGNOSIS — D519 Vitamin B12 deficiency anemia, unspecified: Secondary | ICD-10-CM

## 2015-05-21 ENCOUNTER — Other Ambulatory Visit: Payer: Medicare Other

## 2015-05-21 ENCOUNTER — Ambulatory Visit: Payer: Medicare Other | Admitting: Hematology and Oncology

## 2015-05-21 NOTE — Assessment & Plan Note (Signed)
Multifactorial anemia : some degree of blood loss with profound B-12 deficiency along with anemia chronic disease  recent hospitalization for ischemic digits : placed on aspirin therapy   Treatment plan: 1.  Continue with monthly B-12 injections as long as it is feasible ( home B-12 injections would be ideal) 2.  Multiple comorbidities and illnesses.   Return to clinic in 6 months for follow-up
